# Patient Record
Sex: Female | Born: 1965 | Race: White | Hispanic: No | Smoking: Never smoker
Health system: Southern US, Community
[De-identification: ages and names within clinical notes are randomized; demographics above are authoritative.]

## PROBLEM LIST (undated history)

## (undated) DIAGNOSIS — J189 Pneumonia, unspecified organism: Secondary | ICD-10-CM

## (undated) DIAGNOSIS — F419 Anxiety disorder, unspecified: Secondary | ICD-10-CM

## (undated) DIAGNOSIS — M545 Low back pain, unspecified: Secondary | ICD-10-CM

## (undated) DIAGNOSIS — I10 Essential (primary) hypertension: Secondary | ICD-10-CM

## (undated) DIAGNOSIS — F329 Major depressive disorder, single episode, unspecified: Secondary | ICD-10-CM

## (undated) DIAGNOSIS — Z8719 Personal history of other diseases of the digestive system: Secondary | ICD-10-CM

## (undated) DIAGNOSIS — D839 Common variable immunodeficiency, unspecified: Secondary | ICD-10-CM

## (undated) DIAGNOSIS — J690 Pneumonitis due to inhalation of food and vomit: Secondary | ICD-10-CM

## (undated) DIAGNOSIS — K219 Gastro-esophageal reflux disease without esophagitis: Secondary | ICD-10-CM

## (undated) DIAGNOSIS — F319 Bipolar disorder, unspecified: Secondary | ICD-10-CM

## (undated) DIAGNOSIS — M1990 Unspecified osteoarthritis, unspecified site: Secondary | ICD-10-CM

## (undated) DIAGNOSIS — G8929 Other chronic pain: Secondary | ICD-10-CM

## (undated) DIAGNOSIS — T50902A Poisoning by unspecified drugs, medicaments and biological substances, intentional self-harm, initial encounter: Secondary | ICD-10-CM

## (undated) DIAGNOSIS — Z87442 Personal history of urinary calculi: Secondary | ICD-10-CM

## (undated) DIAGNOSIS — F32A Depression, unspecified: Secondary | ICD-10-CM

## (undated) HISTORY — PX: WRIST FRACTURE SURGERY: SHX121

## (undated) HISTORY — PX: APPENDECTOMY: SHX54

## (undated) HISTORY — PX: CHOLECYSTECTOMY OPEN: SUR202

## (undated) HISTORY — PX: CARPAL TUNNEL RELEASE: SHX101

## (undated) HISTORY — PX: NISSEN FUNDOPLICATION: SHX2091

## (undated) HISTORY — PX: ABDOMINAL HYSTERECTOMY: SHX81

## (undated) HISTORY — PX: TONSILLECTOMY: SUR1361

## (undated) HISTORY — PX: TOTAL SHOULDER REPLACEMENT: SUR1217

## (undated) HISTORY — PX: HERNIA REPAIR: SHX51

---

## 2000-01-24 ENCOUNTER — Other Ambulatory Visit: Admission: RE | Admit: 2000-01-24 | Discharge: 2000-01-24 | Payer: Self-pay | Admitting: Gynecology

## 2008-02-18 ENCOUNTER — Emergency Department (HOSPITAL_COMMUNITY): Admission: EM | Admit: 2008-02-18 | Discharge: 2008-02-18 | Payer: Self-pay | Admitting: Emergency Medicine

## 2009-08-12 ENCOUNTER — Emergency Department (HOSPITAL_COMMUNITY): Admission: EM | Admit: 2009-08-12 | Discharge: 2009-08-13 | Payer: Self-pay | Admitting: Emergency Medicine

## 2009-08-20 ENCOUNTER — Emergency Department (HOSPITAL_COMMUNITY): Admission: EM | Admit: 2009-08-20 | Discharge: 2009-08-21 | Payer: Self-pay | Admitting: Emergency Medicine

## 2010-11-04 ENCOUNTER — Ambulatory Visit (INDEPENDENT_AMBULATORY_CARE_PROVIDER_SITE_OTHER): Payer: Medicare Other | Admitting: Emergency Medicine

## 2010-11-04 ENCOUNTER — Encounter: Payer: Self-pay | Admitting: Emergency Medicine

## 2010-11-04 ENCOUNTER — Telehealth (INDEPENDENT_AMBULATORY_CARE_PROVIDER_SITE_OTHER): Payer: Self-pay | Admitting: *Deleted

## 2010-11-04 DIAGNOSIS — H00019 Hordeolum externum unspecified eye, unspecified eyelid: Secondary | ICD-10-CM | POA: Insufficient documentation

## 2010-11-04 DIAGNOSIS — J069 Acute upper respiratory infection, unspecified: Secondary | ICD-10-CM | POA: Insufficient documentation

## 2010-11-04 DIAGNOSIS — M7520 Bicipital tendinitis, unspecified shoulder: Secondary | ICD-10-CM

## 2010-11-09 NOTE — Assessment & Plan Note (Signed)
Summary: Sick/Ear Pain/Shoulder Pull   Vital Signs:  Patient profile:   45 year old female Height:      62 inches Weight:      164 pounds BMI:     30.10 O2 Sat:      97 % on Room air Temp:     99.1 degrees F oral Pulse rate:   100 / minute Resp:     16 per minute BP sitting:   129 / 96  (left arm) Cuff size:   regular  Vitals Entered By: Lajean Saver RN (November 04, 2010 9:48 AM)  O2 Flow:  Room air CC: congestion, runny nose, sinus pressure, fever, right eye drainage x 4 days, left shoulder pain x 5 days Is Patient Diabetic? No Pain Assessment Patient in pain? yes     Location: left shoulder Intensity: 5 Type: aching Onset of pain  Gradual Comments Patient c/o left shoulder pain and numbness x 5 days. She carried luggage about six days ago and believes it may be attributed to that. She developed congestion, sinus pressure/drainage, right eye swelling and drainage and HA 4 days ago. SHe has used alka seltzer cold.   Chief Complaint:  congestion, runny nose, sinus pressure, fever, right eye drainage x 4 days, and left shoulder pain x 5 days.  History of Present Illness: 1) Left shoulder pain.  Started this past weekend after carrying luggage.  No difficulty moving shoulder, no radiation of pain.  Not using any meds or modalities.  Located front of shoulder.  2) R eye redness.  No trauma, no debris.  Started a few days ago.  Using warm compress.  Mild redness, +lid irritation, no visual problems.  91) 44 Years Old Female complains of onset of cold symptoms for 5 days.  Jaysha has been using no OTC meds.  She is immocompromised.  She got it from her sister. + sore throat + cough No pleuritic pain No wheezing + nasal congestion + post-nasal drainage + sinus pain/pressure No chest congestion No itchy/red eyes No earache No hemoptysis No SOB No chills/sweats + fever (max 100) No nausea No vomiting No abdominal pain No diarrhea No skin rashes No fatigue No  myalgias + headache   Preventive Screening-Counseling & Management  Alcohol-Tobacco     Smoking Status: never      Drug Use:  no.    Allergies (verified): 1)  ! Jonne Ply  Past History:  Past Medical History: Chrones Hx of salmonella Monthly IVIG  Past Surgical History: Large bowel resection Cholecystectomy Portacath placement  Family History: Family History of Asthma Family History Hypertension  Social History: Never Smoked Alcohol use-no Drug use-no Married Smoking Status:  never Drug Use:  no  Physical Exam  General:  Well-developed,well-nourished,in no acute distress; alert,appropriate and cooperative throughout examination Head:  Normocephalic and atraumatic without obvious abnormalities. No apparent alopecia or balding. Eyes:  Stye upper inner lateral eyelid, mild conjunctival erythema Ears:  External ear exam shows no significant lesions or deformities.  Otoscopic examination reveals clear canals, tympanic membranes are intact bilaterally without bulging, retraction, inflammation or discharge. Hearing is grossly normal bilaterally. Nose:  clear discharge Mouth:  mild erythema, no exudates, OP patent, clear PND Lungs:  Normal respiratory effort, chest expands symmetrically. Lungs are clear to auscultation, no crackles or wheezes. Heart:  Normal rate and regular rhythm. S1 and S2 normal without gallop, murmur, click, rub or other extra sounds. Extremities:  L Shoulder: Inspection reveals no abnormalities, atrophy or asymmetry.  Palpation  shows + TTP bicipital groove.  ROM is full in all planes. Rotator cuff strength normal throughout. Negative Neer and Hawkin's tests, empty can.  + Yergason's test.  Neg O'briens, normal scapular function.  Distal NV status intact.   Impression & Recommendations:  Problem # 1:  STYE (ICD-373.11) Warm compresses Rx for Polytrim  Problem # 2:  URI (ICD-465.9)  1)  Take the prescribed antibiotic as instructed (Zpak) 2)  Use nasal  saline solution (over the counter) at least 3 times a day. 3)  Use over the counter decongestants like Zyrtec-D every 12 hours as needed to help with congestion. 4)  Can take tylenol every 6 hours or motrin every 8 hours for pain or fever. 5)  Follow up with your primary doctor  if no improvement in 5-7 days, sooner if increasing pain, fever, or new symptoms.    Her updated medication list for this problem includes:    Meloxicam 7.5 Mg Tabs (Meloxicam) .Marland Kitchen... 1 two times a day as needed for pain  Orders: Pulse Oximetry (94760)  Problem # 3:  BICEPS TENDINITIS (ICD-726.12) Ice, rest Rx for Mobic for a few days. If not improving by next week, call clinic and we will schedule you with sports medicine for possible injection vs PT Avoid lifting, pushing, pulling  Complete Medication List: 1)  Lipitor 80 Mg Tabs (Atorvastatin calcium) .... Once nightly 2)  Cymbalta 60 Mg Cpep (Duloxetine hcl) .... Once daily 3)  Polytrim 10000-0.1 Unit/ml-% Soln (Polymyxin b-trimethoprim) .... 2 drops r eye q6 hrs 4)  Meloxicam 7.5 Mg Tabs (Meloxicam) .Marland Kitchen.. 1 two times a day as needed for pain 5)  Zithromax Z-pak 250 Mg Tabs (Azithromycin) .... Use as directed Prescriptions: ZITHROMAX Z-PAK 250 MG TABS (AZITHROMYCIN) use as directed  #1 x 0   Entered and Authorized by:   Hoyt Koch MD   Signed by:   Hoyt Koch MD on 11/04/2010   Method used:   Print then Give to Patient   RxID:   [id number] MELOXICAM 7.5 MG TABS (MELOXICAM) 1 two times a day as needed for pain  #30 x 0   Entered and Authorized by:   Hoyt Koch MD   Signed by:   Hoyt Koch MD on 11/04/2010   Method used:   Print then Give to Patient   RxID:   [id number] POLYTRIM 10000-0.1 UNIT/ML-% SOLN (POLYMYXIN B-TRIMETHOPRIM) 2 drops R eye Q6 hrs  #1 bottle x 0   Entered and Authorized by:   Hoyt Koch MD   Signed by:   Hoyt Koch MD on 11/04/2010   Method used:   Print then Give to Patient    RxID:   [id number]    Orders Added: 1)  New Patient Level IV [53664] 2)  Pulse Oximetry [40347]

## 2010-11-17 NOTE — Progress Notes (Signed)
  Phone Note Outgoing Call   Call placed by: Lajean Saver RN,  November 04, 2010 1:39 PM Call placed to: [facility] Summary of Call: Pharmacy: [facility] [place] [phone]. Voltaren gel 1% apply two times a day, dispense 100gm tube. 0 refills.  Patient requested new rx for pain because rx med contains aspirin.

## 2011-01-04 LAB — RAPID URINE DRUG SCREEN, HOSP PERFORMED
Amphetamines: NOT DETECTED
Barbiturates: NOT DETECTED
Benzodiazepines: POSITIVE — AB

## 2011-01-04 LAB — DIFFERENTIAL
Basophils Absolute: 0.1 10*3/uL (ref 0.0–0.1)
Eosinophils Absolute: 0.1 10*3/uL (ref 0.0–0.7)
Eosinophils Relative: 2 % (ref 0–5)
Lymphocytes Relative: 37 % (ref 12–46)
Lymphs Abs: 2.5 10*3/uL (ref 0.7–4.0)
Monocytes Absolute: 0.4 10*3/uL (ref 0.1–1.0)

## 2011-01-04 LAB — CBC
HCT: 37.4 % (ref 36.0–46.0)
Hemoglobin: 12.7 g/dL (ref 12.0–15.0)
MCV: 97.3 fL (ref 78.0–100.0)
Platelets: 294 10*3/uL (ref 150–400)
RDW: 13.6 % (ref 11.5–15.5)

## 2011-01-04 LAB — BASIC METABOLIC PANEL
BUN: 15 mg/dL (ref 6–23)
CO2: 24 mEq/L (ref 19–32)
Chloride: 109 mEq/L (ref 96–112)
Glucose, Bld: 97 mg/dL (ref 70–99)
Potassium: 3.8 mEq/L (ref 3.5–5.1)
Sodium: 140 mEq/L (ref 135–145)

## 2011-01-04 LAB — ETHANOL: Alcohol, Ethyl (B): 88 mg/dL — ABNORMAL HIGH (ref 0–10)

## 2018-06-30 ENCOUNTER — Emergency Department (HOSPITAL_COMMUNITY): Payer: Medicare Other

## 2018-06-30 ENCOUNTER — Inpatient Hospital Stay (HOSPITAL_COMMUNITY)
Admission: EM | Admit: 2018-06-30 | Discharge: 2018-07-04 | DRG: 917 | Disposition: A | Discharge status: Other Acute Inpatient Hospital | Payer: Medicare Other | Attending: Family Medicine | Admitting: Family Medicine

## 2018-06-30 ENCOUNTER — Other Ambulatory Visit: Payer: Self-pay

## 2018-06-30 DIAGNOSIS — E872 Acidosis, unspecified: Secondary | ICD-10-CM

## 2018-06-30 DIAGNOSIS — R7401 Elevation of levels of liver transaminase levels: Secondary | ICD-10-CM | POA: Diagnosis present

## 2018-06-30 DIAGNOSIS — W19XXXA Unspecified fall, initial encounter: Secondary | ICD-10-CM | POA: Diagnosis not present

## 2018-06-30 DIAGNOSIS — M25559 Pain in unspecified hip: Secondary | ICD-10-CM | POA: Diagnosis present

## 2018-06-30 DIAGNOSIS — K219 Gastro-esophageal reflux disease without esophagitis: Secondary | ICD-10-CM | POA: Diagnosis present

## 2018-06-30 DIAGNOSIS — F319 Bipolar disorder, unspecified: Secondary | ICD-10-CM | POA: Diagnosis present

## 2018-06-30 DIAGNOSIS — N179 Acute kidney failure, unspecified: Secondary | ICD-10-CM | POA: Diagnosis present

## 2018-06-30 DIAGNOSIS — R9431 Abnormal electrocardiogram [ECG] [EKG]: Secondary | ICD-10-CM

## 2018-06-30 DIAGNOSIS — I4581 Long QT syndrome: Secondary | ICD-10-CM | POA: Diagnosis present

## 2018-06-30 DIAGNOSIS — T50902A Poisoning by unspecified drugs, medicaments and biological substances, intentional self-harm, initial encounter: Secondary | ICD-10-CM

## 2018-06-30 DIAGNOSIS — Z87440 Personal history of urinary (tract) infections: Secondary | ICD-10-CM

## 2018-06-30 DIAGNOSIS — J181 Lobar pneumonia, unspecified organism: Secondary | ICD-10-CM

## 2018-06-30 DIAGNOSIS — J690 Pneumonitis due to inhalation of food and vomit: Secondary | ICD-10-CM | POA: Diagnosis not present

## 2018-06-30 DIAGNOSIS — G92 Toxic encephalopathy: Secondary | ICD-10-CM | POA: Diagnosis present

## 2018-06-30 DIAGNOSIS — Z79899 Other long term (current) drug therapy: Secondary | ICD-10-CM | POA: Diagnosis not present

## 2018-06-30 DIAGNOSIS — J9602 Acute respiratory failure with hypercapnia: Secondary | ICD-10-CM

## 2018-06-30 DIAGNOSIS — E861 Hypovolemia: Secondary | ICD-10-CM | POA: Diagnosis present

## 2018-06-30 DIAGNOSIS — Z888 Allergy status to other drugs, medicaments and biological substances status: Secondary | ICD-10-CM

## 2018-06-30 DIAGNOSIS — M810 Age-related osteoporosis without current pathological fracture: Secondary | ICD-10-CM | POA: Diagnosis present

## 2018-06-30 DIAGNOSIS — T424X2A Poisoning by benzodiazepines, intentional self-harm, initial encounter: Secondary | ICD-10-CM | POA: Diagnosis present

## 2018-06-30 DIAGNOSIS — T1491XA Suicide attempt, initial encounter: Secondary | ICD-10-CM | POA: Diagnosis not present

## 2018-06-30 DIAGNOSIS — G8929 Other chronic pain: Secondary | ICD-10-CM | POA: Diagnosis present

## 2018-06-30 DIAGNOSIS — G2581 Restless legs syndrome: Secondary | ICD-10-CM | POA: Diagnosis present

## 2018-06-30 DIAGNOSIS — J189 Pneumonia, unspecified organism: Secondary | ICD-10-CM

## 2018-06-30 DIAGNOSIS — F419 Anxiety disorder, unspecified: Secondary | ICD-10-CM | POA: Diagnosis present

## 2018-06-30 DIAGNOSIS — T796XXA Traumatic ischemia of muscle, initial encounter: Secondary | ICD-10-CM | POA: Diagnosis not present

## 2018-06-30 DIAGNOSIS — M549 Dorsalgia, unspecified: Secondary | ICD-10-CM | POA: Diagnosis present

## 2018-06-30 DIAGNOSIS — M6282 Rhabdomyolysis: Secondary | ICD-10-CM | POA: Diagnosis present

## 2018-06-30 DIAGNOSIS — Y92009 Unspecified place in unspecified non-institutional (private) residence as the place of occurrence of the external cause: Secondary | ICD-10-CM | POA: Diagnosis not present

## 2018-06-30 DIAGNOSIS — Z915 Personal history of self-harm: Secondary | ICD-10-CM

## 2018-06-30 DIAGNOSIS — I10 Essential (primary) hypertension: Secondary | ICD-10-CM | POA: Diagnosis present

## 2018-06-30 DIAGNOSIS — M5124 Other intervertebral disc displacement, thoracic region: Secondary | ICD-10-CM | POA: Diagnosis present

## 2018-06-30 DIAGNOSIS — Z886 Allergy status to analgesic agent status: Secondary | ICD-10-CM

## 2018-06-30 DIAGNOSIS — T50901A Poisoning by unspecified drugs, medicaments and biological substances, accidental (unintentional), initial encounter: Secondary | ICD-10-CM | POA: Diagnosis present

## 2018-06-30 DIAGNOSIS — R740 Nonspecific elevation of levels of transaminase and lactic acid dehydrogenase [LDH]: Secondary | ICD-10-CM | POA: Diagnosis present

## 2018-06-30 DIAGNOSIS — T50904A Poisoning by unspecified drugs, medicaments and biological substances, undetermined, initial encounter: Secondary | ICD-10-CM | POA: Diagnosis not present

## 2018-06-30 DIAGNOSIS — G9341 Metabolic encephalopathy: Secondary | ICD-10-CM | POA: Diagnosis not present

## 2018-06-30 DIAGNOSIS — T481X2A Poisoning by skeletal muscle relaxants [neuromuscular blocking agents], intentional self-harm, initial encounter: Principal | ICD-10-CM | POA: Diagnosis present

## 2018-06-30 DIAGNOSIS — R52 Pain, unspecified: Secondary | ICD-10-CM

## 2018-06-30 DIAGNOSIS — K589 Irritable bowel syndrome without diarrhea: Secondary | ICD-10-CM | POA: Diagnosis present

## 2018-06-30 HISTORY — DX: Low back pain: M54.5

## 2018-06-30 HISTORY — DX: Personal history of other diseases of the digestive system: Z87.19

## 2018-06-30 HISTORY — DX: Depression, unspecified: F32.A

## 2018-06-30 HISTORY — DX: Poisoning by unspecified drugs, medicaments and biological substances, intentional self-harm, initial encounter: T50.902A

## 2018-06-30 HISTORY — DX: Essential (primary) hypertension: I10

## 2018-06-30 HISTORY — DX: Major depressive disorder, single episode, unspecified: F32.9

## 2018-06-30 HISTORY — DX: Other chronic pain: G89.29

## 2018-06-30 HISTORY — DX: Anxiety disorder, unspecified: F41.9

## 2018-06-30 HISTORY — DX: Common variable immunodeficiency, unspecified: D83.9

## 2018-06-30 HISTORY — DX: Unspecified osteoarthritis, unspecified site: M19.90

## 2018-06-30 HISTORY — DX: Pneumonitis due to inhalation of food and vomit: J69.0

## 2018-06-30 HISTORY — DX: Personal history of urinary calculi: Z87.442

## 2018-06-30 HISTORY — DX: Gastro-esophageal reflux disease without esophagitis: K21.9

## 2018-06-30 HISTORY — DX: Pneumonia, unspecified organism: J18.9

## 2018-06-30 HISTORY — DX: Low back pain, unspecified: M54.50

## 2018-06-30 HISTORY — DX: Bipolar disorder, unspecified: F31.9

## 2018-06-30 LAB — COMPREHENSIVE METABOLIC PANEL
ALK PHOS: 61 U/L (ref 38–126)
ALT: 84 U/L — AB (ref 0–44)
AST: 185 U/L — ABNORMAL HIGH (ref 15–41)
Albumin: 3.7 g/dL (ref 3.5–5.0)
Anion gap: 19 — ABNORMAL HIGH (ref 5–15)
BUN: 23 mg/dL — ABNORMAL HIGH (ref 6–20)
CO2: 15 mmol/L — ABNORMAL LOW (ref 22–32)
CREATININE: 1.61 mg/dL — AB (ref 0.44–1.00)
Calcium: 8.6 mg/dL — ABNORMAL LOW (ref 8.9–10.3)
Chloride: 103 mmol/L (ref 98–111)
GFR calc Af Amer: 42 mL/min — ABNORMAL LOW (ref >60)
GFR, EST NON AFRICAN AMERICAN: 36 mL/min — AB (ref >60)
Glucose, Bld: 96 mg/dL (ref 70–99)
Potassium: 4.4 mmol/L (ref 3.5–5.1)
Sodium: 137 mmol/L (ref 135–145)
Total Bilirubin: 1.3 mg/dL — ABNORMAL HIGH (ref 0.3–1.2)
Total Protein: 6.5 g/dL (ref 6.5–8.1)

## 2018-06-30 LAB — CBC WITH DIFFERENTIAL/PLATELET
BASOS ABS: 0.1 10*3/uL (ref 0.0–0.1)
Basophils Relative: 1 %
Eosinophils Absolute: 0.1 10*3/uL (ref 0.0–0.7)
Eosinophils Relative: 1 %
HEMATOCRIT: 50.2 % — AB (ref 36.0–46.0)
HEMOGLOBIN: 16.5 g/dL — AB (ref 12.0–15.0)
LYMPHS PCT: 15 %
Lymphs Abs: 0.9 10*3/uL (ref 0.7–4.0)
MCH: 32.9 pg (ref 26.0–34.0)
MCHC: 32.9 g/dL (ref 30.0–36.0)
MCV: 100.2 fL — AB (ref 78.0–100.0)
MONOS PCT: 9 %
Monocytes Absolute: 0.5 10*3/uL (ref 0.1–1.0)
NEUTROS ABS: 4.5 10*3/uL (ref 1.7–7.7)
Neutrophils Relative %: 74 %
Platelets: 249 10*3/uL (ref 150–400)
RBC: 5.01 MIL/uL (ref 3.87–5.11)
RDW: 11.9 % (ref 11.5–15.5)
WBC: 6.1 10*3/uL (ref 4.0–10.5)

## 2018-06-30 LAB — URINALYSIS, ROUTINE W REFLEX MICROSCOPIC
Bilirubin Urine: NEGATIVE
Glucose, UA: NEGATIVE mg/dL
KETONES UR: 80 mg/dL — AB
Leukocytes, UA: NEGATIVE
Nitrite: NEGATIVE
PROTEIN: 30 mg/dL — AB
Specific Gravity, Urine: 1.019 (ref 1.005–1.030)
pH: 5 (ref 5.0–8.0)

## 2018-06-30 LAB — I-STAT VENOUS BLOOD GAS, ED
ACID-BASE DEFICIT: 8 mmol/L — AB (ref 0.0–2.0)
Bicarbonate: 17.6 mmol/L — ABNORMAL LOW (ref 20.0–28.0)
O2 SAT: 97 %
PCO2 VEN: 36 mmHg — AB (ref 44.0–60.0)
TCO2: 19 mmol/L — ABNORMAL LOW (ref 22–32)
pH, Ven: 7.297 (ref 7.250–7.430)
pO2, Ven: 95 mmHg — ABNORMAL HIGH (ref 32.0–45.0)

## 2018-06-30 LAB — I-STAT ARTERIAL BLOOD GAS, ED
ACID-BASE DEFICIT: 13 mmol/L — AB (ref 0.0–2.0)
ACID-BASE DEFICIT: 12 mmol/L — AB (ref 0.0–2.0)
BICARBONATE: 17.4 mmol/L — AB (ref 20.0–28.0)
Bicarbonate: 15.4 mmol/L — ABNORMAL LOW (ref 20.0–28.0)
O2 SAT: 97 %
O2 Saturation: 95 %
TCO2: 19 mmol/L — AB (ref 22–32)
TCO2: 17 mmol/L — AB (ref 22–32)
pCO2 arterial: 58.4 mmHg — ABNORMAL HIGH (ref 32.0–48.0)
pCO2 arterial: 38.9 mmHg (ref 32.0–48.0)
pH, Arterial: 7.087 — CL (ref 7.350–7.450)
pH, Arterial: 7.209 — ABNORMAL LOW (ref 7.350–7.450)
pO2, Arterial: 104 mmHg (ref 83.0–108.0)
pO2, Arterial: 107 mmHg (ref 83.0–108.0)

## 2018-06-30 LAB — ETHANOL: Alcohol, Ethyl (B): <10 mg/dL (ref <10)

## 2018-06-30 LAB — RAPID URINE DRUG SCREEN, HOSP PERFORMED
AMPHETAMINES: POSITIVE — AB
BARBITURATES: NOT DETECTED
BENZODIAZEPINES: POSITIVE — AB
Cocaine: NOT DETECTED
Opiates: POSITIVE — AB
TETRAHYDROCANNABINOL: NOT DETECTED

## 2018-06-30 LAB — I-STAT CG4 LACTIC ACID, ED: LACTIC ACID, VENOUS: 2.56 mmol/L — AB (ref 0.5–1.9)

## 2018-06-30 LAB — MAGNESIUM: Magnesium: 2 mg/dL (ref 1.7–2.4)

## 2018-06-30 LAB — SALICYLATE LEVEL

## 2018-06-30 LAB — LACTIC ACID, PLASMA: LACTIC ACID, VENOUS: 1.5 mmol/L (ref 0.5–1.9)

## 2018-06-30 LAB — PROCALCITONIN: PROCALCITONIN: 24.13 ng/mL

## 2018-06-30 LAB — ACETAMINOPHEN LEVEL: Acetaminophen (Tylenol), Serum: <10 ug/mL — ABNORMAL LOW (ref 10–30)

## 2018-06-30 MED ORDER — ROCURONIUM BROMIDE 50 MG/5ML IV SOLN
INTRAVENOUS | Status: AC | PRN
  Administered 2018-06-30: 100 mg via INTRAVENOUS

## 2018-06-30 MED ORDER — FENTANYL CITRATE (PF) 100 MCG/2ML IJ SOLN
50.0000 ug | INTRAMUSCULAR | Status: DC | PRN
  Administered 2018-06-30: 50 ug via INTRAVENOUS
  Filled 2018-06-30: qty 2

## 2018-06-30 MED ORDER — PANTOPRAZOLE SODIUM 40 MG PO PACK: 40.0000 mg | PACK | Freq: Every day | ORAL | Status: DC

## 2018-06-30 MED ORDER — ROCURONIUM BROMIDE 50 MG/5ML IV SOLN
80.0000 mg | Freq: Once | INTRAVENOUS | Status: DC
  Filled 2018-06-30: qty 8

## 2018-06-30 MED ORDER — FENTANYL CITRATE (PF) 100 MCG/2ML IJ SOLN
50.0000 ug | INTRAMUSCULAR | Status: DC | PRN
  Administered 2018-07-01: 50 ug via INTRAVENOUS
  Filled 2018-06-30: qty 2

## 2018-06-30 MED ORDER — PROPOFOL 1000 MG/100ML IV EMUL
INTRAVENOUS | Status: AC
  Filled 2018-06-30: qty 100

## 2018-06-30 MED ORDER — LACTATED RINGERS IV SOLN
INTRAVENOUS | Status: DC
  Administered 2018-07-01: 01:00:00 via INTRAVENOUS

## 2018-06-30 MED ORDER — FENTANYL CITRATE (PF) 100 MCG/2ML IJ SOLN: 50.0000 ug | INTRAMUSCULAR | Status: DC | PRN

## 2018-06-30 MED ORDER — PIPERACILLIN-TAZOBACTAM 3.375 G IVPB
3.3750 g | Freq: Three times a day (TID) | INTRAVENOUS | Status: DC
  Administered 2018-07-01 – 2018-07-03 (×8): 3.375 g via INTRAVENOUS
  Filled 2018-06-30 (×11): qty 50

## 2018-06-30 MED ORDER — PIPERACILLIN-TAZOBACTAM 3.375 G IVPB 30 MIN
3.3750 g | Freq: Once | INTRAVENOUS | Status: AC
  Administered 2018-06-30: 3.375 g via INTRAVENOUS
  Filled 2018-06-30: qty 50

## 2018-06-30 MED ORDER — SODIUM CHLORIDE 0.9 % IV BOLUS
1000.0000 mL | Freq: Once | INTRAVENOUS | Status: AC
  Administered 2018-06-30: 1000 mL via INTRAVENOUS

## 2018-06-30 MED ORDER — HEPARIN SODIUM (PORCINE) 5000 UNIT/ML IJ SOLN
5000.0000 [IU] | Freq: Three times a day (TID) | INTRAMUSCULAR | Status: DC
  Administered 2018-07-01 – 2018-07-03 (×9): 5000 [IU] via SUBCUTANEOUS
  Filled 2018-06-30 (×9): qty 1

## 2018-06-30 MED ORDER — LACTATED RINGERS IV BOLUS
500.0000 mL | Freq: Once | INTRAVENOUS | Status: AC
  Administered 2018-06-30: 500 mL via INTRAVENOUS

## 2018-06-30 NOTE — Progress Notes (Signed)
Pharmacy Antibiotic Note  Kim Massey is a 52 y.o. female admitted on 06/30/2018 with possible aspiration pneumonia.  Pharmacy has been consulted for zosyn dosing. -WBC= 6.1, afeb, SCr= 1.61, CrCL ~ 40 -zosyn 3.375gm given at ~ 8pm  Plan: -Zosyn 3.375gm IV q8h -Will follow renal function, cultures and clinical progress    Height: 5\' 2"  (157.5 cm) Weight: 150 lb (68 kg) IBW/kg (Calculated) : 50.1  Temp (24hrs), Avg:98.8 F (37.1 C), Min:96.6 F (35.9 C), Max:99.9 F (37.7 C)  Recent Labs  Lab 06/30/18 1752 06/30/18 1815  WBC 6.1  --   CREATININE 1.61*  --   LATICACIDVEN  --  2.56*    Estimated Creatinine Clearance: 37.4 mL/min (A) (by C-G formula based on SCr of 1.61 mg/dL (H)).    Allergies  Allergen Reactions  . Nsaids Other (See Comments)    Due to crohn's disease  . Propoxyphene Nausea And Vomiting  . Aspirin Other (See Comments)    bleeding      Thank you for allowing pharmacy to be a part of this patient's care.  Harland German, PharmD Clinical Pharmacist Please check Amion for pharmacy contact number

## 2018-06-30 NOTE — Progress Notes (Signed)
   06/30/18 2100  Clinical Encounter Type  Visited With Family;Health care provider  Visit Type Initial;ED  Stress Factors  Family Stress Factors Loss of control;Other (Comment) (worried about their friend)   Spoke w/ boyfriend and another friend of pt in waiting room.  Conveyed information they shared w/ the care RN.  Per boyfriend, pt does have family, but he did not think they would be coming to hospital.  Margretta Sidle resident

## 2018-06-30 NOTE — ED Triage Notes (Signed)
Pt brought in by [facility] from home- roommate found pt on floor agonal and cyanotic with two empty pill bottles around her (cyclobenzaprine and clonazepam). Pt given 2mg  narcan IN by fire with some improvement. Pt responding only to painful stimuli on arrival, EDP at bedside.

## 2018-06-30 NOTE — ED Provider Notes (Signed)
Procedure Name: Intubation Date/Time: 06/30/2018 8:55 PM Performed by: Keenan Bachelor, MD Pre-anesthesia Checklist: Patient identified, Emergency Drugs available, Suction available, Patient being monitored and Timeout performed Oxygen Delivery Method: Non-rebreather mask Preoxygenation: Pre-oxygenation with 100% oxygen Induction Type: Rapid sequence Ventilation: Mask ventilation without difficulty Laryngoscope Size: Mac and 3 Grade View: Grade I Tube size: 7.5 mm Number of attempts: 1 Airway Equipment and Method: Rigid stylet and Video-laryngoscopy Secured at: 23 cm Tube secured with: ETT holder Difficulty Due To: Difficulty was unanticipated         Keenan Bachelor, MD 06/30/18 2056    Elnora Morrison, MD 06/30/18 2218

## 2018-06-30 NOTE — H&P (Addendum)
..   NAME:  Kim Massey, MRN:  [id number], DOB:  02/04/1966, LOS: 0 ADMISSION DATE:  06/30/2018, CONSULTATION DATE:  06/30/18 REFERRING MD:  Jodi Mourning MD- EDP, CHIEF COMPLAINT:  Overdose   Brief History   52 yr old female with PMHx of Bipolar, HTN and CVID w/ suspected overdose ( found next to empty bottles of clonazepam and Flexeril) Utox Positive for Opiates, Amphetamines and Benzodiazepines. Intubated for airway protection PCCM consulted for admission.   Past Medical History( per Care Everywhere and EMR)  - Bipolar - HTN - Osteoporosis - CVID Dx 2009 -  Significant Hospital Events   Presented w/ OD intubated  Consults: date of consult/date signed off & final recs:  None to date  Procedures (surgical and bedside):  Endotracheal intubation by EDP  Significant Diagnostic Tests:  Utox + Benzodiazepines, Opiates and Amphetamines Both post intubation ABG: 7.087/58/104/17---> 7.20/30/107/15  Micro Data:  pending  Antimicrobials:  Zosyn IV empirically   Subjective:  Unable to obtain any history from patient due to acute toxic encephalopathy, intubated.  Objective   Blood pressure 126/75, pulse (!) 117, temperature 98.8 F (37.1 C), resp. rate 18, height 5\' 2"  (1.575 m), weight 68 kg, SpO2 100 %.    Vent Mode: PRVC FiO2 (%):  [100 %] 100 % Set Rate:  [14 bmp-18 bmp] 18 bmp Vt Set:  [400 mL] 400 mL PEEP:  [5 cmH20] 5 cmH20 Plateau Pressure:  [18 cmH20] 18 cmH20   Intake/Output Summary (Last 24 hours) at 06/30/2018 2138 Last data filed at 06/30/2018 2105 Gross per 24 hour  Intake 2600 ml  Output -  Net 2600 ml   Filed Weights   06/30/18 2014  Weight: 68 kg    Examination: General: well nourished female in no distress HENT: intubated oropharynx pink moist mucosa PERRLA Lungs: good air entry b/l no wheezing or rhonci Cardiovascular: S1 and S2 appreciated without murmur Abdomen: soft abdomen nondistended some tenderness in suprapubic area + Bs in all 4  quadrants Extremities: Right thigh appear slightly hypertrophied in comparison to left with some erythema and warm to touch. + 1 pitting edema b/l Neuro: grimaces and withdraws from noxious stimuli, opens eyes to name, moves all 4 extremities GU: erythematous follicles seen around mons pubis, indwelling temp foley in place otherwise not impressive   Assessment & Plan:  52 yr old female with PMHx CVID and Bipolar dz presents to [facility] s/p suspected intentional OD. Pills found include flexeril and clonazepam. Intubated for airway protection. PCCM consulted for admission  1. Acute toxic metabolic encephalopathy secondary to overdose with benzodiazepines, opiates and amphetamines.  Plan: - Cardiac monitoring, Prolonged QT : QTc last was 477. Avoid QT prolonging meds - Respiratory depression with benzodiazepines and opiates. Pt is endotracheally intubated and will continue on mechanical ventilation until she can maintain a state of wakefulness. No continuous sedation at this time. Neurochecks Q 4. Seizure and Aspiration precautions.  - Intentional OD? Once extubated she will need a psychiatric evaluation as this may have been an attempt to harm herself. She has a h/o Bipolar type 1.   2.  Respiratory acidosis with Anion Gap Metabolic Acidosis- AG 19  PCO2 has improved with mechanical ventilation however ph still 7.2 due to metabolic acidosis. initially LA 2.56--> LA 1.5 post IVF resuscitation will check CK.  BG wnl, salicylates < 7 tylenol <10 and ETOH <10. BUN elevated from baseline normal 15 now 23, continue to monitor UOP  Continue IVF.    3. Possible Pneumonia  Elevated PCT- WBC 6.1 Temp max 99.7deg F Dense right upper lobe consolidation concerning for a pneumonia. Additional mild infiltrate at the right lateral lung base.- will start on broad spectrum abx Vancomycin and Zosyn given h/o CVID pt is susceptible to infections such as Strep pneumo - will send Urine strep. Other sources of infection: Pt has  a swollen erythematous right quadricep. No puncture marks seen. Will continue to monitor may warrant further investigation  4.  + Ketones in urine, ETOH level <10.  Macrocytosis on CBC   5. H/o Bipolar Type 1 - h/o a single manic episode coupled with major Depression ( recently broke up with her significant other which may have been precitipus for this current OD attempt) normally controlled with Duloxetine 90mg  daily per July 2019 last PCP visit and Topamax 50 mg daily.   6. Pt has a h/o GERD and IBS: Currently taking Questran, Colestid and Bentyl. Still reports diarrhea. Continues Protonix 40 mg daily. Last colonoscopy was 09/2017 and she had 8 polyps removed which were benign.   7. RLS- was being treated with Klonipin - will hold this medication as she may have used it for OD. Check iron studies.   Disposition / Summary of Today's Plan 06/30/18   Admit to ICU Continues on mechanical ventilation Wean sedation to see neurological response Continue IVF for lactic acidosis- hopefully clear lactate. F/u CK and repeat ABG    Diet: NPO Pain/Anxiety/Delirium protocol (if indicated): hold sedation and analgesia at this time for neurological assessment VAP protocol (if indicated): yes DVT prophylaxis: SCDs and Hep [place] GI prophylaxis: yes protonix Hyperglycemia protocol: if BG>180mg /dl will start ISS for goal 140-180 mg/dl Mobility: bedrest Code Status: Full code Family Communication: none at bedside at the time of my assessment will call to update  Labs   CBC: Recent Labs  Lab 06/30/18 1752  WBC 6.1  NEUTROABS 4.5  HGB 16.5*  HCT 50.2*  MCV 100.2*  PLT 249    Basic Metabolic Panel: Recent Labs  Lab 06/30/18 1752  NA 137  K 4.4  CL 103  CO2 15*  GLUCOSE 96  BUN 23*  CREATININE 1.61*  CALCIUM 8.6*  MG 2.0   GFR: Estimated Creatinine Clearance: 37.4 mL/min (A) (by C-G formula based on SCr of 1.61 mg/dL (H)). Recent Labs  Lab 06/30/18 1752 06/30/18 1815  WBC 6.1  --    LATICACIDVEN  --  2.56*    Liver Function Tests: Recent Labs  Lab 06/30/18 1752  AST 185*  ALT 84*  ALKPHOS 61  BILITOT 1.3*  PROT 6.5  ALBUMIN 3.7   No results for input(s): LIPASE, AMYLASE in the last 168 hours. No results for input(s): AMMONIA in the last 168 hours.  ABG    Component Value Date/Time   PHART 7.087 (LL) 06/30/2018 1939   PCO2ART 58.4 (H) 06/30/2018 1939   PO2ART 104.0 06/30/2018 1939   HCO3 17.4 (L) 06/30/2018 1939   TCO2 19 (L) 06/30/2018 1939   ACIDBASEDEF 13.0 (H) 06/30/2018 1939   O2SAT 95.0 06/30/2018 1939     Coagulation Profile: No results for input(s): INR, PROTIME in the last 168 hours.  Cardiac Enzymes: No results for input(s): CKTOTAL, CKMB, CKMBINDEX, TROPONINI in the last 168 hours.  HbA1C: No results found for: HGBA1C  CBG: No results for input(s): GLUCAP in the last 168 hours.  Admitting History of Present Illness.   Per EMR and accounts of other providers as patient is acutely encephalopathic  52 yr old female  with PMHx of Bipolar, HTN and CVID presented to [facility] on 9/[facility] brought in by [facility] from home- roommate found pt on floor agonal and cyanotic with two empty pill bottles around her (cyclobenzaprine and clonazepam). Pt given 2mg  narcan IN by fire with some improvement. Pt responding only to painful stimuli on arrival,    Per EDP documentation: no family at bedside (Chaplain spoke to boyfriend who stated that she had family but he didn't think they would come to the hospital) presents with EMS after being found by reported roommate unresponsive with empty pill bottles at the bedside.  They found empty bottles of clonazepam and Flexeril.  No other drugs known.  Patient was unresponsive for EMS on arrival and continued on route.  Unable to get details.  On my evaluation: pt withdraws from painful stimuli and opens her eyes to her name. Remains intubated hemodynamically stable Review of Systems:   Marland KitchenMarland KitchenReview of Systems  Unable to  perform ROS: Critical illness  intubated and intoxicated   Past Medical History  She,  has no past medical history on file.   Surgical History   none  Social History   Social History   Socioeconomic History  . Marital status: Unknown    Spouse name: Not on file  . Number of children: Not on file  . Years of education: Not on file  . Highest education level: Not on file  Occupational History  . Not on file  Social Needs  . Financial resource strain: Not on file  . Food insecurity:    Worry: Not on file    Inability: Not on file  . Transportation needs:    Medical: Not on file    Non-medical: Not on file  Tobacco Use  . Smoking status: Not on file  Substance and Sexual Activity  . Alcohol use: Not on file  . Drug use: Not on file  . Sexual activity: Not on file  Lifestyle  . Physical activity:    Days per week: Not on file    Minutes per session: Not on file  . Stress: Not on file  Relationships  . Social connections:    Talks on phone: Not on file    Gets together: Not on file    Attends religious service: Not on file    Active member of club or organization: Not on file    Attends meetings of clubs or organizations: Not on file    Relationship status: Not on file  . Intimate partner violence:    Fear of current or ex partner: Not on file    Emotionally abused: Not on file    Physically abused: Not on file    Forced sexual activity: Not on file  Other Topics Concern  . Not on file  Social History Narrative  . Not on file  ,     Family History   Her family history is not on file.   Allergies Allergies  Allergen Reactions  . Nsaids Other (See Comments)    Due to crohn's disease  . Propoxyphene Nausea And Vomiting  . Aspirin Other (See Comments)    bleeding     Home Medications  Prior to Admission medications   Medication Sig Start Date End Date Taking? Authorizing Provider  acetaminophen (TYLENOL) 500 MG tablet Take 1,000 mg by mouth every 4  (four) hours as needed for headache (pain).   Yes [provider]  baclofen (LIORESAL) 10 MG tablet Take 10 mg by mouth  3 (three) times daily. 06/26/18  Yes [provider]  clonazePAM (KLONOPIN) 0.5 MG tablet Take 0.5 mg by mouth 2 (two) times daily as needed for anxiety.  06/18/18  Yes [provider]  cyclobenzaprine (FLEXERIL) 5 MG tablet Take 5 mg by mouth 2 (two) times daily. 06/25/18  Yes [provider]  DULoxetine (CYMBALTA) 60 MG capsule Take 60 mg by mouth daily. 04/09/18  Yes [provider]  HYDROcodone-acetaminophen (NORCO/VICODIN) 5-325 MG tablet Take 1 tablet by mouth every 8 (eight) hours as needed (pain).  06/26/18  Yes [provider]  loratadine (CLARITIN) 10 MG tablet Take 10 mg by mouth daily as needed for allergies.   Yes [provider]  pantoprazole (PROTONIX) 40 MG tablet Take 40 mg by mouth daily.   Yes [provider]  gabapentin (NEURONTIN) 100 MG capsule Take 100 mg by mouth See admin instructions. Start date 06/18/18: take 1 capsule (100 mg) by mouth at bedtime for 4 days, then take 1 capsule (100 mg) twice daily for 4 days, then take 1 capsule (100 mg) three times daily. 06/18/18   [provider]  gabapentin (NEURONTIN) 300 MG capsule Take 300 mg by mouth See admin instructions. Start date 06/18/18: take 1 capsule (100 mg) by mouth at bedtime for 4 days, then take 1 capsule (100 mg) twice daily for 4 days, then take 1 capsule (100 mg) three times daily. 06/18/18   [provider]       I, Dr Newell Coral have personally reviewed patient's available data, including medical history, events of note, physical examination and test results as part of my evaluation. I have discussed with NP and other care providers such as pharmacist, RN and Elink.  In addition,  I personally evaluated patient The patient is critically ill with multiple organ systems failure and requires high complexity  decision making for assessment and support, frequent evaluation and titration of therapies, application of advanced monitoring technologies and extensive interpretation of multiple databases.   Critical Care Time devoted to patient care services described in this note is 55 Minutes. This time reflects time of care of this signee Dr Newell Coral. This critical care time does not reflect procedure time, or teaching time or supervisory time of NP but could involve care discussion time   CC TIME:55  minutes CODE STATUS:FULL DISPOSITION: ICU PROGNOSIS: Guarded   Dr. Newell Coral Pulmonary Critical Care Medicine  06/30/2018 10:55 PM

## 2018-06-30 NOTE — ED Provider Notes (Addendum)
[facility] EMERGENCY DEPARTMENT Provider Note   CSN: [id number] Arrival date & time: 06/30/18  1738     History   Chief Complaint Chief Complaint  Patient presents with  . Ingestion    HPI Kim Massey is a 52 y.o. female.  Patient with no known significant medical history, no family at bedside presents with EMS after being found by reported roommate unresponsive with empty pill bottles at the bedside.  They found empty bottles of clonazepam and Flexeril.  No other drugs known.  Patient was unresponsive for EMS on arrival and continued on route.  Unable to get details.     No past medical history on file.  Patient Active Problem List   Diagnosis Date Noted  . Overdose 06/30/2018  . STYE 11/04/2010  . URI 11/04/2010  . BICEPS TENDINITIS 11/04/2010       OB History   None      Home Medications    Prior to Admission medications   Medication Sig Start Date End Date Taking? Authorizing Provider  acetaminophen (TYLENOL) 500 MG tablet Take 1,000 mg by mouth every 4 (four) hours as needed for headache (pain).   Yes [provider]  baclofen (LIORESAL) 10 MG tablet Take 10 mg by mouth 3 (three) times daily. 06/26/18  Yes [provider]  clonazePAM (KLONOPIN) 0.5 MG tablet Take 0.5 mg by mouth 2 (two) times daily as needed for anxiety.  06/18/18  Yes [provider]  cyclobenzaprine (FLEXERIL) 5 MG tablet Take 5 mg by mouth 2 (two) times daily. 06/25/18  Yes [provider]  DULoxetine (CYMBALTA) 60 MG capsule Take 60 mg by mouth daily. 04/09/18  Yes [provider]  HYDROcodone-acetaminophen (NORCO/VICODIN) 5-325 MG tablet Take 1 tablet by mouth every 8 (eight) hours as needed (pain).  06/26/18  Yes [provider]  loratadine (CLARITIN) 10 MG tablet Take 10 mg by mouth daily as needed for allergies.   Yes [provider]  pantoprazole (PROTONIX) 40 MG tablet Take 40 mg by mouth daily.   Yes  [provider]  gabapentin (NEURONTIN) 100 MG capsule Take 100 mg by mouth See admin instructions. Start date 06/18/18: take 1 capsule (100 mg) by mouth at bedtime for 4 days, then take 1 capsule (100 mg) twice daily for 4 days, then take 1 capsule (100 mg) three times daily. 06/18/18   [provider]  gabapentin (NEURONTIN) 300 MG capsule Take 300 mg by mouth See admin instructions. Start date 06/18/18: take 1 capsule (100 mg) by mouth at bedtime for 4 days, then take 1 capsule (100 mg) twice daily for 4 days, then take 1 capsule (100 mg) three times daily. 06/18/18   [provider]    Family History No family history on file.  Social History Social History   Tobacco Use  . Smoking status: Not on file  Substance Use Topics  . Alcohol use: Not on file  . Drug use: Not on file     Allergies   Nsaids; Propoxyphene; and Aspirin   Review of Systems Review of Systems  Unable to perform ROS: Mental status change     Physical Exam Updated Vital Signs BP (!) 103/40   Pulse (!) 121   Temp 99.3 F (37.4 C)   Resp 19   Ht 5\' 2"  (1.575 m)   Wt 68 kg   SpO2 100%   BMI 27.44 kg/m   Physical Exam  Constitutional: She appears well-developed. She  appears distressed.  HENT:  Head: Normocephalic.  Gurgling/congested  Eyes: No scleral icterus.  Minimally reactive pupils equal bilateral  Neck: Neck supple. No tracheal deviation present.  Cardiovascular: Tachycardia present.  Pulmonary/Chest: She is in respiratory distress.  Tachypnea, increased effort  Abdominal: She exhibits no distension.  Neurological: A cranial nerve deficit is present. GCS eye subscore is 1. GCS verbal subscore is 2. GCS motor subscore is 4.  No significant response to pain, general lethargy, general weakness  Skin: Capillary refill takes less than 2 seconds.  Psychiatric:  Unresponsive  Nursing note and vitals reviewed.    ED Treatments / Results  Labs (all labs ordered are  listed, but only abnormal results are displayed) Labs Reviewed  ACETAMINOPHEN LEVEL - Abnormal; Notable for the following components:      Result Value   Acetaminophen (Tylenol), Serum <10 (*)    All other components within normal limits  COMPREHENSIVE METABOLIC PANEL - Abnormal; Notable for the following components:   CO2 15 (*)    BUN 23 (*)    Creatinine, Ser 1.61 (*)    Calcium 8.6 (*)    AST 185 (*)    ALT 84 (*)    Total Bilirubin 1.3 (*)    GFR calc non Af Amer 36 (*)    GFR calc Af Amer 42 (*)    Anion gap 19 (*)    All other components within normal limits  URINALYSIS, ROUTINE W REFLEX MICROSCOPIC - Abnormal; Notable for the following components:   APPearance HAZY (*)    Hgb urine dipstick LARGE (*)    Ketones, ur 80 (*)    Protein, ur 30 (*)    Bacteria, UA RARE (*)    All other components within normal limits  CBC WITH DIFFERENTIAL/PLATELET - Abnormal; Notable for the following components:   Hemoglobin 16.5 (*)    HCT 50.2 (*)    MCV 100.2 (*)    All other components within normal limits  RAPID URINE DRUG SCREEN, HOSP PERFORMED - Abnormal; Notable for the following components:   Opiates POSITIVE (*)    Benzodiazepines POSITIVE (*)    Amphetamines POSITIVE (*)    All other components within normal limits  I-STAT CG4 LACTIC ACID, ED - Abnormal; Notable for the following components:   Lactic Acid, Venous 2.56 (*)    All other components within normal limits  I-STAT VENOUS BLOOD GAS, ED - Abnormal; Notable for the following components:   pCO2, Ven 36.0 (*)    pO2, Ven 95.0 (*)    Bicarbonate 17.6 (*)    TCO2 19 (*)    Acid-base deficit 8.0 (*)    All other components within normal limits  I-STAT ARTERIAL BLOOD GAS, ED - Abnormal; Notable for the following components:   pH, Arterial 7.087 (*)    pCO2 arterial 58.4 (*)    Bicarbonate 17.4 (*)    TCO2 19 (*)    Acid-base deficit 13.0 (*)    All other components within normal limits  I-STAT ARTERIAL BLOOD GAS,  ED - Abnormal; Notable for the following components:   pH, Arterial 7.209 (*)    Bicarbonate 15.4 (*)    TCO2 17 (*)    Acid-base deficit 12.0 (*)    All other components within normal limits  CULTURE, RESPIRATORY  CULTURE, BLOOD (ROUTINE X 2)  CULTURE, BLOOD (ROUTINE X 2)  ETHANOL  SALICYLATE LEVEL  MAGNESIUM  HIV ANTIBODY (ROUTINE TESTING W REFLEX)  BLOOD GAS, ARTERIAL  LACTIC ACID,  PLASMA  LACTIC ACID, PLASMA  PROCALCITONIN  PROCALCITONIN  COMPREHENSIVE METABOLIC PANEL  CBC  MAGNESIUM    EKG EKG Interpretation  Date/Time:  Sunday June 30 2018 17:48:15 EDT Ventricular Rate:  143 PR Interval:    QRS Duration: 80 QT Interval:  331 QTC Calculation: 511 R Axis:   119 Text Interpretation:  Sinus tachycardia Ventricular premature complex Right axis deviation Prolonged QT interval Confirmed by Blane Ohara [phone]) on 06/30/2018 6:09:24 PM   Radiology Dg Chest Portable 1 View  Result Date: 06/30/2018 CLINICAL DATA:  Altered mental status EXAM: PORTABLE CHEST 1 VIEW COMPARISON:  Report 09/05/2014 FINDINGS: Endotracheal tube tip is about 19 mm superior to the carina. Esophageal tube tip overlies the proximal stomach. Right-sided central venous catheter tip overlies the proximal right atrium. Streaky atelectasis at the left base. Dense consolidation in the right upper lobe with air bronchograms. Patchy infiltrate at the peripheral right lung base. Mildly dilated bowel in the upper abdomen. IMPRESSION: 1. Endotracheal tube tip about 19 mm superior to the carina. Esophageal tube tip overlies the proximal stomach 2. Dense right upper lobe consolidation concerning for a pneumonia. Additional mild infiltrate at the right lateral lung base. Electronically Signed   By: Jasmine Pang M.D.   On: 06/30/2018 19:18    Procedures .Critical Care Performed by: Blane Ohara, MD Authorized by: Blane Ohara, MD   Critical care provider statement:    Critical care time (minutes):  35    Critical care start time:  06/30/2018 6:00 PM   Critical care end time:  06/30/2018 6:35 PM   Critical care time was exclusive of:  Separately billable procedures and treating other patients and teaching time   Critical care was necessary to treat or prevent imminent or life-threatening deterioration of the following conditions:  Respiratory failure and toxidrome   Critical care was time spent personally by me on the following activities:  Discussions with consultants, evaluation of patient's response to treatment, examination of patient, ordering and performing treatments and interventions, ordering and review of laboratory studies, ordering and review of radiographic studies, pulse oximetry, re-evaluation of patient's condition, obtaining history from patient or surrogate and review of old charts   I assumed direction of critical care for this patient from another provider in my specialty: no    Procedure Name: Intubation Date/Time: 06/30/2018 6:36 PM Performed by: Blane Ohara, MD Pre-anesthesia Checklist: Patient identified, Emergency Drugs available, Suction available, Patient being monitored and Timeout performed Oxygen Delivery Method: Non-rebreather mask Preoxygenation: Pre-oxygenation with 100% oxygen Induction Type: Rapid sequence Ventilation: Mask ventilation without difficulty Laryngoscope Size: Glidescope and 3 Grade View: Grade I Tube size: 7.5 mm Number of attempts: 1 Airway Equipment and Method: Stylet Placement Confirmation: ETT inserted through vocal cords under direct vision,  Positive ETCO2,  CO2 detector and Breath sounds checked- equal and bilateral Tube secured with: ETT holder      (including critical care time)  Medications Ordered in ED Medications  propofol (DIPRIVAN) 1000 MG/100ML infusion (has no administration in time range)  heparin injection 5,000 Units (has no administration in time range)  pantoprazole sodium (PROTONIX) 40 mg/20 mL oral suspension 40  mg (has no administration in time range)  fentaNYL (SUBLIMAZE) injection 50 mcg (has no administration in time range)  fentaNYL (SUBLIMAZE) injection 50 mcg (has no administration in time range)  lactated ringers infusion (has no administration in time range)  piperacillin-tazobactam (ZOSYN) IVPB 3.375 g (has no administration in time range)  sodium chloride 0.9 % bolus 1,000  mL (0 mLs Intravenous Stopped 06/30/18 1831)  sodium chloride 0.9 % bolus 1,000 mL (0 mLs Intravenous Stopped 06/30/18 2105)  lactated ringers bolus 500 mL (0 mLs Intravenous Stopped 06/30/18 2045)  piperacillin-tazobactam (ZOSYN) IVPB 3.375 g (0 g Intravenous Stopped 06/30/18 2045)  rocuronium (ZEMURON) injection (100 mg Intravenous Given 06/30/18 1818)     Initial Impression / Assessment and Plan / ED Course  I have reviewed the triage vital signs and the nursing notes.  Pertinent labs & imaging results that were available during my care of the patient were reviewed by me and considered in my medical decision making (see chart for details).    Patient with unknown medical history and minimal details to recent events presents significant altered mental status and clinical concern for drug overdose with empty pill bottles at the bedside.  Patient tachycardic, tachypneic, concern for aspiration.  2 IVs placed immediately by nursing staff/EMS.  IV fluids given.  Patient placed on a monitor EKG performed QT prolonged we will monitor throughout ED stay.  Broad blood work and drug testing ordered.  Patient's heart rate and blood pressure improved with aggressive supportive care in the ER.  LFTs mild elevated.  Repeat fluid bolus ordered.  Unable to get a hold of family, patient's boyfriend and close friend arrived and gave further details on history of suicide attempt with drugs in the past.  The boyfriend and the patient did have a dispute and break-up prior to this event. Repeat EKG showed heart rate 118, sinus tachycardia,  borderline T wave abnormalities, normal QT Blood work showed leukocytosis, metabolic acidosis, pH approximately 7.1, mild LFT elevation.  Tylenol level and salicylate level negative.  Discussed with critical care for admission. The patients results and plan were reviewed and discussed.   Any x-rays performed were independently reviewed by myself.   Differential diagnosis were considered with the presenting HPI.  Medications  propofol (DIPRIVAN) 1000 MG/100ML infusion (has no administration in time range)  heparin injection 5,000 Units (has no administration in time range)  pantoprazole sodium (PROTONIX) 40 mg/20 mL oral suspension 40 mg (has no administration in time range)  fentaNYL (SUBLIMAZE) injection 50 mcg (has no administration in time range)  fentaNYL (SUBLIMAZE) injection 50 mcg (has no administration in time range)  lactated ringers infusion (has no administration in time range)  piperacillin-tazobactam (ZOSYN) IVPB 3.375 g (has no administration in time range)  sodium chloride 0.9 % bolus 1,000 mL (0 mLs Intravenous Stopped 06/30/18 1831)  sodium chloride 0.9 % bolus 1,000 mL (0 mLs Intravenous Stopped 06/30/18 2105)  lactated ringers bolus 500 mL (0 mLs Intravenous Stopped 06/30/18 2045)  piperacillin-tazobactam (ZOSYN) IVPB 3.375 g (0 g Intravenous Stopped 06/30/18 2045)  rocuronium (ZEMURON) injection (100 mg Intravenous Given 06/30/18 1818)    Vitals:   06/30/18 2115 06/30/18 2130 06/30/18 2145 06/30/18 2213  BP:  (!) 109/59 118/69 (!) 103/40  Pulse: (!) 118 (!) 116 (!) 117 (!) 121  Resp: 17 (!) 26 17 19   Temp: 99 F (37.2 C) 99.1 F (37.3 C) 99.3 F (37.4 C)   SpO2: 100% 100% 100% 100%  Weight:      Height:        Final diagnoses:  Intentional drug overdose, initial encounter (HCC)  Aspiration pneumonia of right upper lobe due to vomit (HCC)  Metabolic acidosis  Prolonged Q-T interval on ECG  Acute respiratory failure with hypercapnia (HCC)    Admission/  observation were discussed with the admitting physician, patient and/or family and  they are comfortable with the plan.   Final Clinical Impressions(s) / ED Diagnoses   Final diagnoses:  Intentional drug overdose, initial encounter [facility])  Aspiration pneumonia of right upper lobe due to vomit (HCC)  Metabolic acidosis  Prolonged Q-T interval on ECG  Acute respiratory failure with hypercapnia [facility])    ED Discharge Orders    None       Blane Ohara, MD 06/30/18 2217    Blane Ohara, MD 06/30/18 2219

## 2018-07-01 ENCOUNTER — Inpatient Hospital Stay (HOSPITAL_COMMUNITY): Payer: Medicare Other

## 2018-07-01 DIAGNOSIS — R9431 Abnormal electrocardiogram [ECG] [EKG]: Secondary | ICD-10-CM

## 2018-07-01 DIAGNOSIS — E872 Acidosis, unspecified: Secondary | ICD-10-CM

## 2018-07-01 DIAGNOSIS — J181 Lobar pneumonia, unspecified organism: Secondary | ICD-10-CM

## 2018-07-01 DIAGNOSIS — J189 Pneumonia, unspecified organism: Secondary | ICD-10-CM

## 2018-07-01 DIAGNOSIS — J9602 Acute respiratory failure with hypercapnia: Secondary | ICD-10-CM

## 2018-07-01 LAB — COMPREHENSIVE METABOLIC PANEL
ALBUMIN: 2.5 g/dL — AB (ref 3.5–5.0)
ALBUMIN: 2.5 g/dL — AB (ref 3.5–5.0)
ALT: 66 U/L — AB (ref 0–44)
ALT: 66 U/L — ABNORMAL HIGH (ref 0–44)
ANION GAP: 11 (ref 5–15)
AST: 142 U/L — AB (ref 15–41)
AST: 144 U/L — ABNORMAL HIGH (ref 15–41)
Alkaline Phosphatase: 40 U/L (ref 38–126)
Alkaline Phosphatase: 41 U/L (ref 38–126)
Anion gap: 11 (ref 5–15)
BUN: 18 mg/dL (ref 6–20)
BUN: 18 mg/dL (ref 6–20)
CHLORIDE: 111 mmol/L (ref 98–111)
CO2: 16 mmol/L — ABNORMAL LOW (ref 22–32)
CO2: 16 mmol/L — ABNORMAL LOW (ref 22–32)
Calcium: 6.8 mg/dL — ABNORMAL LOW (ref 8.9–10.3)
Calcium: 6.9 mg/dL — ABNORMAL LOW (ref 8.9–10.3)
Chloride: 110 mmol/L (ref 98–111)
Creatinine, Ser: 0.96 mg/dL (ref 0.44–1.00)
Creatinine, Ser: 0.99 mg/dL (ref 0.44–1.00)
GFR calc Af Amer: >60 mL/min (ref >60)
GFR calc Af Amer: >60 mL/min (ref >60)
GFR calc non Af Amer: >60 mL/min (ref >60)
GFR calc non Af Amer: >60 mL/min (ref >60)
Glucose, Bld: 147 mg/dL — ABNORMAL HIGH (ref 70–99)
Glucose, Bld: 151 mg/dL — ABNORMAL HIGH (ref 70–99)
POTASSIUM: 4.1 mmol/L (ref 3.5–5.1)
Potassium: 4 mmol/L (ref 3.5–5.1)
SODIUM: 137 mmol/L (ref 135–145)
SODIUM: 138 mmol/L (ref 135–145)
TOTAL PROTEIN: 4.6 g/dL — AB (ref 6.5–8.1)
TOTAL PROTEIN: 4.9 g/dL — AB (ref 6.5–8.1)
Total Bilirubin: 1.3 mg/dL — ABNORMAL HIGH (ref 0.3–1.2)
Total Bilirubin: 1.3 mg/dL — ABNORMAL HIGH (ref 0.3–1.2)

## 2018-07-01 LAB — GLUCOSE, CAPILLARY
GLUCOSE-CAPILLARY: 133 mg/dL — AB (ref 70–99)
Glucose-Capillary: 135 mg/dL — ABNORMAL HIGH (ref 70–99)
Glucose-Capillary: 116 mg/dL — ABNORMAL HIGH (ref 70–99)
Glucose-Capillary: 87 mg/dL (ref 70–99)

## 2018-07-01 LAB — CBC
HEMATOCRIT: 39 % (ref 36.0–46.0)
HEMOGLOBIN: 12.9 g/dL (ref 12.0–15.0)
MCH: 33.4 pg (ref 26.0–34.0)
MCHC: 33.1 g/dL (ref 30.0–36.0)
MCV: 101 fL — ABNORMAL HIGH (ref 78.0–100.0)
Platelets: 175 10*3/uL (ref 150–400)
RBC: 3.86 MIL/uL — ABNORMAL LOW (ref 3.87–5.11)
RDW: 12.2 % (ref 11.5–15.5)
WBC: 7.2 10*3/uL (ref 4.0–10.5)

## 2018-07-01 LAB — POCT I-STAT 3, ART BLOOD GAS (G3+)
Acid-base deficit: 9 mmol/L — ABNORMAL HIGH (ref 0.0–2.0)
Bicarbonate: 16.8 mmol/L — ABNORMAL LOW (ref 20.0–28.0)
O2 SAT: 99 %
PH ART: 7.269 — AB (ref 7.350–7.450)
TCO2: 18 mmol/L — ABNORMAL LOW (ref 22–32)
pCO2 arterial: 37 mmHg (ref 32.0–48.0)
pO2, Arterial: 136 mmHg — ABNORMAL HIGH (ref 83.0–108.0)

## 2018-07-01 LAB — HIV ANTIBODY (ROUTINE TESTING W REFLEX): HIV Screen 4th Generation wRfx: NONREACTIVE

## 2018-07-01 LAB — PROTIME-INR
INR: 1.12
Prothrombin Time: 14.3 seconds (ref 11.4–15.2)

## 2018-07-01 LAB — MAGNESIUM: Magnesium: 1.8 mg/dL (ref 1.7–2.4)

## 2018-07-01 LAB — MRSA PCR SCREENING: MRSA BY PCR: NEGATIVE

## 2018-07-01 LAB — CK: CK TOTAL: 11528 U/L — AB (ref 38–234)

## 2018-07-01 LAB — PROCALCITONIN: Procalcitonin: 26.26 ng/mL

## 2018-07-01 MED ORDER — MAGNESIUM SULFATE 2 GM/50ML IV SOLN
2.0000 g | Freq: Once | INTRAVENOUS | Status: AC
  Administered 2018-07-01: 2 g via INTRAVENOUS
  Filled 2018-07-01: qty 50

## 2018-07-01 MED ORDER — CHLORHEXIDINE GLUCONATE 0.12 % MT SOLN: 15.0000 mL | Freq: Two times a day (BID) | OROMUCOSAL | Status: DC

## 2018-07-01 MED ORDER — SODIUM CHLORIDE 0.9 % IV SOLN
1.0000 g | Freq: Once | INTRAVENOUS | Status: DC
  Filled 2018-07-01: qty 10

## 2018-07-01 MED ORDER — SODIUM BICARBONATE 8.4 % IV SOLN
50.0000 meq | Freq: Once | INTRAVENOUS | Status: AC
  Administered 2018-07-01: 50 meq via INTRAVENOUS
  Filled 2018-07-01: qty 50

## 2018-07-01 MED ORDER — CHLORHEXIDINE GLUCONATE 0.12% ORAL RINSE (MEDLINE KIT)
15.0000 mL | Freq: Two times a day (BID) | OROMUCOSAL | Status: DC
  Administered 2018-07-01 (×2): 15 mL via OROMUCOSAL

## 2018-07-01 MED ORDER — VANCOMYCIN HCL IN DEXTROSE 750-5 MG/150ML-% IV SOLN
750.0000 mg | Freq: Two times a day (BID) | INTRAVENOUS | Status: DC
  Administered 2018-07-01 – 2018-07-03 (×4): 750 mg via INTRAVENOUS
  Filled 2018-07-01 (×5): qty 150

## 2018-07-01 MED ORDER — LACTATED RINGERS IV BOLUS
2000.0000 mL | Freq: Once | INTRAVENOUS | Status: AC
  Administered 2018-07-01: 2000 mL via INTRAVENOUS

## 2018-07-01 MED ORDER — SODIUM CHLORIDE 0.9 % IV SOLN
1.0000 g | Freq: Once | INTRAVENOUS | Status: AC
  Administered 2018-07-01: 1 g via INTRAVENOUS
  Filled 2018-07-01: qty 10

## 2018-07-01 MED ORDER — SODIUM CHLORIDE 0.9 % IV SOLN: INTRAVENOUS | Status: DC | PRN

## 2018-07-01 MED ORDER — VANCOMYCIN HCL IN DEXTROSE 1-5 GM/200ML-% IV SOLN
1000.0000 mg | INTRAVENOUS | Status: DC
  Administered 2018-07-01: 1000 mg via INTRAVENOUS
  Filled 2018-07-01: qty 200

## 2018-07-01 MED ORDER — ORAL CARE MOUTH RINSE: 15.0000 mL | OROMUCOSAL | Status: DC

## 2018-07-01 MED ORDER — ORAL CARE MOUTH RINSE: 15.0000 mL | Freq: Two times a day (BID) | OROMUCOSAL | Status: DC

## 2018-07-01 NOTE — Progress Notes (Signed)
PCCM INTERVAL PROGRESS NOTE  Extubated. Tolerating well. Reports that she does not take benzos every day. She was not attempting to harm herself, just has bad pain. In discussion with boyfriend. He is not so sure that she wasn't trying to self harm. States she has been depressed recently.   Plan:  - Transfer to telemetry - Consult psychiatry  Joneen Roach, [facility] Pulmonary/Critical Care Pager [phone] or [phone]  07/01/2018 11:17 AM

## 2018-07-01 NOTE — Procedures (Signed)
Arterial Catheter Insertion Procedure Note Kim Massey [id number] April 07, 1966  Procedure: Insertion of Arterial Catheter  Indications: Blood pressure monitoring  Procedure Details Consent: Risks of procedure as well as the alternatives and risks of each were explained to the (patient/caregiver).  Consent for procedure obtained. and Unable to obtain consent because of emergent medical necessity. Time Out: Verified patient identification, verified procedure, site/side was marked, verified correct patient position, special equipment/implants available, medications/allergies/relevent history reviewed, required imaging and test results available.  Performed  Maximum sterile technique was used including antiseptics, cap, gloves, gown, hand hygiene, mask and sheet. Skin prep: Chlorhexidine; local anesthetic administered 20 gauge catheter was inserted into left radial artery using the Seldinger technique. ULTRASOUND GUIDANCE USED: NO Evaluation Blood flow good; BP tracing good. Complications: No apparent complications.   Kim Massey 07/01/2018

## 2018-07-01 NOTE — Procedures (Signed)
Extubation Procedure Note  Patient Details:   Name: LIZBETTE WHITCOMB DOB: Jul 13, 1966 MRN: [id number]   Airway Documentation:    Vent end date: 07/01/18 Vent end time: 0915   Evaluation  O2 sats: stable throughout Complications: No apparent complications Patient did tolerate procedure well. Bilateral Breath Sounds: Diminished   Yes  Toula Moos 07/01/2018, 9:23 AM

## 2018-07-01 NOTE — Progress Notes (Signed)
Pharmacy Antibiotic Note  Kim Massey is a 52 y.o. female admitted on 06/30/2018 with overdose.  Pharmacy has been consulted for Vancomycin dosing for PNA. Already on Zosyn. WBC WNL. Noted renal dysfunction. LA improving.   Plan: Vancomycin 1000 mg IV q24h, increase dose if renal function improves Already on Zosyn Trend WBC, temp, renal function  F/U infectious work-up Drug levels as indicated  Height: 5\' 2"  (157.5 cm) Weight: 150 lb (68 kg) IBW/kg (Calculated) : 50.1  Temp (24hrs), Avg:99.1 F (37.3 C), Min:96.6 F (35.9 C), Max:99.9 F (37.7 C)  Recent Labs  Lab 06/30/18 1752 06/30/18 1815 06/30/18 2205  WBC 6.1  --   --   CREATININE 1.61*  --   --   LATICACIDVEN  --  2.56* 1.5    Estimated Creatinine Clearance: 37.4 mL/min (A) (by C-G formula based on SCr of 1.61 mg/dL (H)).    Allergies  Allergen Reactions  . Nsaids Other (See Comments)    Due to crohn's disease  . Propoxyphene Nausea And Vomiting  . Aspirin Other (See Comments)    bleeding   Abran Duke, PharmD, BCPS Clinical Pharmacist Phone: [phone]

## 2018-07-01 NOTE — Progress Notes (Signed)
NAME:  Kim Massey, MRN:  [id number], DOB:  [birth year], LOS: 1 ADMISSION DATE:  06/30/2018, CONSULTATION DATE:  06/30/18 REFERRING MD:  Jodi Mourning MD- EDP, CHIEF COMPLAINT:  Overdose   Brief History   52 yr old female with PMHx of Bipolar, HTN and CVID w/ suspected overdose ( found next to empty bottles of clonazepam and Flexeril) Utox Positive for Opiates, Amphetamines and Benzodiazepines. Intubated for airway protection PCCM consulted for admission.   Past Medical History( per Care Everywhere and EMR)  Bipolar, HTN, Osteoporosis, CVID Dx 2009  Significant Hospital Events   9/29: Presented w/ OD, Intubated 9/30 more awake  Consults: date of consult/date signed off & final recs:  None to date   Procedures (surgical and bedside):  Intubated 9/30 >  Significant Diagnostic Tests:  Utox + Benzodiazepines, Opiates and Amphetamines  Micro Data:  Blood 9/30 > Urine 9/30 > Sputum 9/30 >  Antimicrobials:  Zosyn IV 9/30 > Vancomycin 9/30 >  Subjective:  Per RN no acute issues overnight. Weaning well 5/5. Starting to wake up this AM.   Objective   Blood pressure 112/75, pulse (!) 111, temperature 99.7 F (37.6 C), resp. rate (!) 25, height 5\' 2"  (1.575 m), weight 66.1 kg, SpO2 98 %.    Vent Mode: PSV;CPAP FiO2 (%):  [40 %-100 %] 40 % Set Rate:  [14 bmp-18 bmp] 18 bmp Vt Set:  [400 mL] 400 mL PEEP:  [5 cmH20] 5 cmH20 Pressure Support:  [5 cmH20] 5 cmH20 Plateau Pressure:  [12 cmH20-18 cmH20] 15 cmH20   Intake/Output Summary (Last 24 hours) at 07/01/2018 0902 Last data filed at 07/01/2018 0800 Gross per 24 hour  Intake 5402.22 ml  Output 1575 ml  Net 3827.22 ml   Filed Weights   06/30/18 2014 07/01/18 0036 07/01/18 0248  Weight: 68 kg 66.1 kg 66.1 kg    Examination: General:  Middle aged female on vent in NAD Neuro:  Arousable to voice. Follows commands. Non-focal.  HEENT:  [place]/AT, No JVD noted, PERRL Cardiovascular:  RRR, no MRG. Traced pedal edema.  Lungs:  Clear  bilateral breath sounds Abdomen:  Soft, non-tender, non-distended Musculoskeletal:  No acute deformity or ROM limitation Skin:  Intact, MMM  Assessment & Plan:   Acute toxic metabolic encephalopathy secondary to intentional overdose with benzodiazepines, opiates and amphetamines with associated QT prolongation (477) and Respiratory depression requiring intubation. Weaning well and much more awake this AM - Extubate - Holding home baclofen, clonazepam, cyclobenzaprine, and gabapentin.   - Will need psychiatric evaluation once extubated.   CAP - in immunocompromised host (CVID). Dense right upper lobe consolidation concerning for a pneumonia, likely aspiration given overdose and initial obtundation. Additional mild infiltrate at the right lateral lung base. - Continue zosyn and vancomycin in immunocompromised patient. Low threshold to DV vanco by tomorrow if continues to improve.  - Follow cultures.   AKI improved. Secondary to rhabdomyolysis and hypovolemia - IVF - Repeat BMP  Hypocalcemia (corrected 8.1) - 1 G calcium gluconate.  Anion Gap Metabolic Acidosis- AG 19: likely related to lactic acid and renal failure initially. Improved - continue IVF - Supportive care.   Bipolar Disorder Type 1 - h/o a single manic episode coupled with major Depression ( recently broke up with her significant other which may have been precitipus for this current OD attempt) normally controlled with Duloxetine 90mg  daily per July 2019 last PCP visit and Topamax 50 mg daily.  - Holding home medications for now.  GERD  IBS  Currently taking Questran, Colestid and Bentyl. Still reports diarrhea. Continues Protonix 40 mg daily. Last colonoscopy was 09/2017 and she had 8 polyps removed which were benign.  - Continue home Protonix once taking PO    Disposition / Summary of Today's Plan 07/01/18   Extubate today Continue IVF Repeat labs tomorrow If looks good can transfer out this afternoon Safety sitter  to bedside Will need psych eval.     Diet: NPO - RN to advance once extubated. Pain/Anxiety/Delirium protocol (if indicated): N/a VAP protocol (if indicated): yes DVT prophylaxis: SCDs and Hep [place] GI prophylaxis: yes protonix Hyperglycemia protocol: if BG>180mg /dl will start ISS for goal 140-180 mg/dl Mobility: bedrest Code Status: Full code Family Communication: Patient updated. Boyfriend will be here later today.   Labs   CBC: Recent Labs  Lab 06/30/18 1752 07/01/18 0201  WBC 6.1 7.2  NEUTROABS 4.5  --   HGB 16.5* 12.9  HCT 50.2* 39.0  MCV 100.2* 101.0*  PLT 249 175    Basic Metabolic Panel: Recent Labs  Lab 06/30/18 1752 07/01/18 0201  NA 137 138  137  K 4.4 4.0  4.1  CL 103 111  110  CO2 15* 16*  16*  GLUCOSE 96 147*  151*  BUN 23* 18  18  CREATININE 1.61* 0.96  0.99  CALCIUM 8.6* 6.8*  6.9*  MG 2.0 1.8   GFR: Estimated Creatinine Clearance: 60 mL/min (by C-G formula based on SCr of 0.99 mg/dL). Recent Labs  Lab 06/30/18 1752 06/30/18 1815 06/30/18 2205 07/01/18 0201  PROCALCITON  --   --  24.13 26.26  WBC 6.1  --   --  7.2  LATICACIDVEN  --  2.56* 1.5  --     Liver Function Tests: Recent Labs  Lab 06/30/18 1752 07/01/18 0201  AST 185* 142*  144*  ALT 84* 66*  66*  ALKPHOS 61 41  40  BILITOT 1.3* 1.3*  1.3*  PROT 6.5 4.6*  4.9*  ALBUMIN 3.7 2.5*  2.5*   No results for input(s): LIPASE, AMYLASE in the last 168 hours. No results for input(s): AMMONIA in the last 168 hours.  ABG    Component Value Date/Time   PHART 7.269 (L) 07/01/2018 0250   PCO2ART 37.0 07/01/2018 0250   PO2ART 136.0 (H) 07/01/2018 0250   HCO3 16.8 (L) 07/01/2018 0250   TCO2 18 (L) 07/01/2018 0250   ACIDBASEDEF 9.0 (H) 07/01/2018 0250   O2SAT 99.0 07/01/2018 0250     Coagulation Profile: No results for input(s): INR, PROTIME in the last 168 hours.  Cardiac Enzymes: Recent Labs  Lab 07/01/18 0201  CKTOTAL 11,528*    HbA1C: No results found  for: HGBA1C  CBG: Recent Labs  Lab 07/01/18 0418 07/01/18 0713  GLUCAP 133* 135*    Admitting History of Present Illness.   Per EMR and accounts of other providers as patient is acutely encephalopathic  52 yr old female with PMHx of Bipolar, HTN and CVID presented to [facility] on 9/29 brought in by [facility] from home- roommate found pt on floor agonal and cyanotic with two empty pill bottles around her (cyclobenzaprine and clonazepam). Pt given 2mg  narcan IN by fire with some improvement. Pt responding only to painful stimuli on arrival,    Per EDP documentation: no family at bedside (Chaplain spoke to boyfriend who stated that she had family but he didn't think they would come to the hospital) presents with EMS after being found by reported roommate unresponsive with empty pill  bottles at the bedside.  They found empty bottles of clonazepam and Flexeril.  No other drugs known.  Patient was unresponsive for EMS on arrival and continued on route.  Unable to get details.  On my evaluation: pt withdraws from painful stimuli and opens her eyes to her name. Remains intubated hemodynamically stable  Past Medical History  She,  has no past medical history on file.   Surgical History   none  Social History   Social History   Socioeconomic History  . Marital status: Unknown    Spouse name: Not on file  . Number of children: Not on file  . Years of education: Not on file  . Highest education level: Not on file  Occupational History  . Not on file  Social Needs  . Financial resource strain: Not on file  . Food insecurity:    Worry: Not on file    Inability: Not on file  . Transportation needs:    Medical: Not on file    Non-medical: Not on file  Tobacco Use  . Smoking status: Not on file  Substance and Sexual Activity  . Alcohol use: Not on file  . Drug use: Not on file  . Sexual activity: Not on file  Lifestyle  . Physical activity:    Days per week: Not on file    Minutes per  session: Not on file  . Stress: Not on file  Relationships  . Social connections:    Talks on phone: Not on file    Gets together: Not on file    Attends religious service: Not on file    Active member of club or organization: Not on file    Attends meetings of clubs or organizations: Not on file    Relationship status: Not on file  . Intimate partner violence:    Fear of current or ex partner: Not on file    Emotionally abused: Not on file    Physically abused: Not on file    Forced sexual activity: Not on file  Other Topics Concern  . Not on file  Social History Narrative  . Not on file  ,     Family History   Her family history is not on file.   Allergies Allergies  Allergen Reactions  . Nsaids Other (See Comments)    Due to crohn's disease  . Propoxyphene Nausea And Vomiting  . Aspirin Other (See Comments)    bleeding     Home Medications  Prior to Admission medications   Medication Sig Start Date End Date Taking? Authorizing Provider  acetaminophen (TYLENOL) 500 MG tablet Take 1,000 mg by mouth every 4 (four) hours as needed for headache (pain).   Yes [provider]  baclofen (LIORESAL) 10 MG tablet Take 10 mg by mouth 3 (three) times daily. 06/26/18  Yes [provider]  clonazePAM (KLONOPIN) 0.5 MG tablet Take 0.5 mg by mouth 2 (two) times daily as needed for anxiety.  06/18/18  Yes [provider]  cyclobenzaprine (FLEXERIL) 5 MG tablet Take 5 mg by mouth 2 (two) times daily. 06/25/18  Yes [provider]  DULoxetine (CYMBALTA) 60 MG capsule Take 60 mg by mouth daily. 04/09/18  Yes [provider]  HYDROcodone-acetaminophen (NORCO/VICODIN) 5-325 MG tablet Take 1 tablet by mouth every 8 (eight) hours as needed (pain).  06/26/18  Yes [provider]  loratadine (CLARITIN) 10 MG tablet Take 10 mg by mouth daily as needed for allergies.  Yes [provider]  pantoprazole (PROTONIX) 40 MG tablet Take 40 mg by  mouth daily.   Yes [provider]  gabapentin (NEURONTIN) 100 MG capsule Take 100 mg by mouth See admin instructions. Start date 06/18/18: take 1 capsule (100 mg) by mouth at bedtime for 4 days, then take 1 capsule (100 mg) twice daily for 4 days, then take 1 capsule (100 mg) three times daily. 06/18/18   [provider]  gabapentin (NEURONTIN) 300 MG capsule Take 300 mg by mouth See admin instructions. Start date 06/18/18: take 1 capsule (100 mg) by mouth at bedtime for 4 days, then take 1 capsule (100 mg) twice daily for 4 days, then take 1 capsule (100 mg) three times daily. 06/18/18   [provider]     My critical care time excluding procedures: 24 minutes   Joneen Roach, AGACNP-BC [facility] Pulmonary/Critical Care Pager [phone] or [phone]  07/01/2018 9:25 AM

## 2018-07-01 NOTE — Progress Notes (Signed)
Pharmacy Antibiotic Note  Kim Massey is a 52 y.o. female admitted on 06/30/2018 with overdose.  Pharmacy has been consulted for Vancomycin and Zosyn dosing for aspiration pneumonia. Renal function has improved significantly since admission (CrCl now ~47ml/min). WBC trended up but still WNL, slightly febrile at 100.0.   Plan: Increase Vancomycin to 750 mg IV q12h Continue Zosyn 3.375g IV q8h infused over 4 hours Monitor clinical picture, renal function, VT at steady state F/U C&S, abx de-escalation, LOT   Height: 5\' 2"  (157.5 cm) Weight: 145 lb 11.6 oz (66.1 kg) IBW/kg (Calculated) : 50.1  Temp (24hrs), Avg:99.3 F (37.4 C), Min:96.6 F (35.9 C), Max:100 F (37.8 C)  Recent Labs  Lab 06/30/18 1752 06/30/18 1815 06/30/18 2205 07/01/18 0201  WBC 6.1  --   --  7.2  CREATININE 1.61*  --   --  0.96  0.99  LATICACIDVEN  --  2.56* 1.5  --     Estimated Creatinine Clearance: 60 mL/min (by C-G formula based on SCr of 0.99 mg/dL).    Allergies  Allergen Reactions  . Nsaids Other (See Comments)    Due to crohn's disease  . Propoxyphene Nausea And Vomiting  . Aspirin Other (See Comments)    bleeding    Harlow Mares, PharmD PGY1 Pharmacy Resident Phone [phone]  07/01/2018   6:31 AM

## 2018-07-01 NOTE — Progress Notes (Signed)
With verbal order from Joneen Roach, CCMNP-Left Radial Arterial Line Removed from pt without any complications. Pt no longer needs arterial BP monitoring at this time.

## 2018-07-02 ENCOUNTER — Ambulatory Visit (HOSPITAL_COMMUNITY): Payer: Medicare Other

## 2018-07-02 ENCOUNTER — Inpatient Hospital Stay (HOSPITAL_COMMUNITY): Payer: Medicare Other

## 2018-07-02 DIAGNOSIS — R7401 Elevation of levels of liver transaminase levels: Secondary | ICD-10-CM | POA: Diagnosis present

## 2018-07-02 DIAGNOSIS — T1491XA Suicide attempt, initial encounter: Secondary | ICD-10-CM

## 2018-07-02 DIAGNOSIS — Y92009 Unspecified place in unspecified non-institutional (private) residence as the place of occurrence of the external cause: Secondary | ICD-10-CM

## 2018-07-02 DIAGNOSIS — R740 Nonspecific elevation of levels of transaminase and lactic acid dehydrogenase [LDH]: Secondary | ICD-10-CM

## 2018-07-02 DIAGNOSIS — W19XXXA Unspecified fall, initial encounter: Secondary | ICD-10-CM

## 2018-07-02 DIAGNOSIS — G9341 Metabolic encephalopathy: Secondary | ICD-10-CM | POA: Diagnosis present

## 2018-07-02 DIAGNOSIS — M6282 Rhabdomyolysis: Secondary | ICD-10-CM | POA: Diagnosis present

## 2018-07-02 DIAGNOSIS — J690 Pneumonitis due to inhalation of food and vomit: Secondary | ICD-10-CM

## 2018-07-02 DIAGNOSIS — T481X2A Poisoning by skeletal muscle relaxants [neuromuscular blocking agents], intentional self-harm, initial encounter: Principal | ICD-10-CM

## 2018-07-02 DIAGNOSIS — T424X2A Poisoning by benzodiazepines, intentional self-harm, initial encounter: Secondary | ICD-10-CM

## 2018-07-02 HISTORY — PX: FACET JOINT INJECTION: SHX5016

## 2018-07-02 LAB — CBC
HCT: 32.6 % — ABNORMAL LOW (ref 36.0–46.0)
Hemoglobin: 10.7 g/dL — ABNORMAL LOW (ref 12.0–15.0)
MCH: 33.1 pg (ref 26.0–34.0)
MCHC: 32.8 g/dL (ref 30.0–36.0)
MCV: 100.9 fL — ABNORMAL HIGH (ref 78.0–100.0)
Platelets: 155 10*3/uL (ref 150–400)
RBC: 3.23 MIL/uL — ABNORMAL LOW (ref 3.87–5.11)
RDW: 12 % (ref 11.5–15.5)
WBC: 8.8 10*3/uL (ref 4.0–10.5)

## 2018-07-02 LAB — HEPATIC FUNCTION PANEL
ALBUMIN: 2.3 g/dL — AB (ref 3.5–5.0)
ALT: 76 U/L — ABNORMAL HIGH (ref 0–44)
AST: 176 U/L — ABNORMAL HIGH (ref 15–41)
Alkaline Phosphatase: 47 U/L (ref 38–126)
BILIRUBIN TOTAL: 0.5 mg/dL (ref 0.3–1.2)
Bilirubin, Direct: 0.2 mg/dL (ref 0.0–0.2)
Indirect Bilirubin: 0.3 mg/dL (ref 0.3–0.9)
TOTAL PROTEIN: 4.9 g/dL — AB (ref 6.5–8.1)

## 2018-07-02 LAB — BASIC METABOLIC PANEL
Anion gap: 8 (ref 5–15)
BUN: 6 mg/dL (ref 6–20)
CALCIUM: 8.5 mg/dL — AB (ref 8.9–10.3)
CO2: 23 mmol/L (ref 22–32)
Chloride: 109 mmol/L (ref 98–111)
Creatinine, Ser: 0.66 mg/dL (ref 0.44–1.00)
GFR calc Af Amer: >60 mL/min (ref >60)
Glucose, Bld: 110 mg/dL — ABNORMAL HIGH (ref 70–99)
Potassium: 3.5 mmol/L (ref 3.5–5.1)
Sodium: 140 mmol/L (ref 135–145)

## 2018-07-02 LAB — URINE CULTURE: Culture: NO GROWTH

## 2018-07-02 LAB — GLUCOSE, CAPILLARY
GLUCOSE-CAPILLARY: 123 mg/dL — AB (ref 70–99)
GLUCOSE-CAPILLARY: 103 mg/dL — AB (ref 70–99)

## 2018-07-02 LAB — PROCALCITONIN: Procalcitonin: 8.25 ng/mL

## 2018-07-02 LAB — CK: CK TOTAL: 8407 U/L — AB (ref 38–234)

## 2018-07-02 MED ORDER — ACETAMINOPHEN 325 MG PO TABS
650.0000 mg | ORAL_TABLET | ORAL | Status: DC | PRN
  Administered 2018-07-02 – 2018-07-03 (×4): 650 mg via ORAL
  Filled 2018-07-02 (×4): qty 2

## 2018-07-02 MED ORDER — LIDOCAINE 5 % EX PTCH
1.0000 | MEDICATED_PATCH | CUTANEOUS | Status: DC
  Administered 2018-07-02 – 2018-07-04 (×3): 1 via TRANSDERMAL
  Filled 2018-07-02 (×3): qty 1

## 2018-07-02 MED ORDER — SODIUM CHLORIDE 0.9 % IV SOLN
INTRAVENOUS | Status: DC
  Administered 2018-07-02 – 2018-07-04 (×4): via INTRAVENOUS

## 2018-07-02 MED ORDER — OXYCODONE HCL 5 MG PO TABS
5.0000 mg | ORAL_TABLET | ORAL | Status: DC | PRN
  Administered 2018-07-02 – 2018-07-04 (×6): 5 mg via ORAL
  Filled 2018-07-02 (×7): qty 1

## 2018-07-02 NOTE — Consult Note (Signed)
Kim Massey   Reason for Massey:  Suicide attempt  Referring Physician:  Dr. Tana Coast Patient Identification: Kim Massey MRN:  [id number] Principal Diagnosis: Overdose Diagnosis:   Patient Active Problem List   Diagnosis Date Noted  . Rhabdomyolysis [M62.82] 07/02/2018  . Fall at home, initial encounter [W19.Merril Abbe, V94.801] 07/02/2018  . Acute metabolic encephalopathy [K55.37] 07/02/2018  . Transaminitis [R74.0] 07/02/2018  . Prolonged Q-T interval on ECG [R94.31]   . Metabolic acidosis [S82.7]   . Acute respiratory failure with hypercapnia ([place]) [J96.02]   . Pneumonia of right upper lobe due to infectious organism ([place]) [J18.1]   . Overdose [T50.901A] 06/30/2018  . STYE [M78.675] 11/04/2010  . URI [J06.9] 11/04/2010  . BICEPS TENDINITIS [M75.20] 11/04/2010    Total Time spent with patient: 1 hour  Subjective:   Kim Massey is a 52 y.o. female patient admitted with suicide attempt by overdose with Klonopin and Flexeril.  HPI:   Per chart review, patient was found unresponsive by her roommate with empty pill bottles of Klonopin and Fexeril at bedside. She was intubated for airway protection and admitted to the ICU. UDS was positive for benzodiazepines, opiates and amphetamines and BAL was negative on admission. She has a history of BPAD. She denies SI and reports that she was taking these medications for pain. Her boyfriend is unsure if she was trying to harm herself. He reports that she has been depressed. Home medications include Klonopin 0.5 mg BID PRN, Cymbalta 60 mg daily and Gabapentin 100 mg TID. She last filled Klonopin on 9/17 by [facility].   On interview, Kim Massey reports that she is not depressed and denies attempting suicide.  She reports that she took up to 5 tablets of Klonopin and Flexeril for hip and back pain.  She reports a history of bulging discs (T1-T5) and osteoporosis.  She has been taking pain medications which have not  helped with her pain since July.  She planned to get injections today.  She reports a history of depression in the past although she denies current symptoms.  She reports taking Cymbalta for chronic pain since 2014.  She denies problems with anxiety at this time.  She later reports that she was dealing with some worries about a purchase her fianc made last weekend but they have spoken about it and she feels better.  She did not elaborate on the situation.  She takes Klonopin for anxiety and restless legs.  She reports using Klonopin sparingly and does not require it daily.  She was informed that her last refill was on 9/17 per PMP and therefore there was a discrepancy in the amount of pills that should have been remaining in her bottle after ingestion.  She then reported that she flushed some pills down the toilet on Saturday but was unable to explain why.  She denies a history of manic symptoms (decreased need for sleep, increased energy, pressured speech or euphoria).  She denies problems with sleep or appetite.  She denies access to guns weapons at home.  Patient's fianc was present at bedside with her permission.  He reports that he does not have any concerns for her safety although per medical record family reported she has been severely depressed and there was concern that she would harm herself.  He reports going to check on her Sunday morning in the guest bedroom and she was sleep.  He reports checking on her again in the afternoon and he heard her making  gurgling sounds so he called her friend who advised him to call EMS.  He reports that it is not uncommon for them to sleep separately since he snores.   Past Psychiatric History: BPAD  Risk to Self:  Yes given drug overdose.  Risk to Others:  None. Denies HI. Prior Inpatient Therapy:  Denies  Prior Outpatient Therapy:  Denies   Past Medical History: No past medical history on file. The histories are not reviewed yet. Please review them in the  "History" navigator section and refresh this [place]. Family History: No family history on file. Family Psychiatric  History: Brother-BPAD.  Social History:  Social History   Substance and Sexual Activity  Alcohol Use Not on file     Social History   Substance and Sexual Activity  Drug Use Not on file    Social History   Socioeconomic History  . Marital status: Unknown    Spouse name: Not on file  . Number of children: Not on file  . Years of education: Not on file  . Highest education level: Not on file  Occupational History  . Not on file  Social Needs  . Financial resource strain: Not on file  . Food insecurity:    Worry: Not on file    Inability: Not on file  . Transportation needs:    Medical: Not on file    Non-medical: Not on file  Tobacco Use  . Smoking status: Not on file  Substance and Sexual Activity  . Alcohol use: Not on file  . Drug use: Not on file  . Sexual activity: Not on file  Lifestyle  . Physical activity:    Days per week: Not on file    Minutes per session: Not on file  . Stress: Not on file  Relationships  . Social connections:    Talks on phone: Not on file    Gets together: Not on file    Attends religious service: Not on file    Active member of club or organization: Not on file    Attends meetings of clubs or organizations: Not on file    Relationship status: Not on file  Other Topics Concern  . Not on file  Social History Narrative  . Not on file   Additional Social History: She lives at home with her fiance. She has one adult daughter and 2 adult stepsons. She is unemployed. She receives disability. She reports social alcohol use. She denies illicit substance use although UDS was positive for amphetamines.     Allergies:   Allergies  Allergen Reactions  . Nsaids Other (See Comments)    Due to crohn's disease  . Propoxyphene Nausea And Vomiting  . Aspirin Other (See Comments)    bleeding    Labs:  Results for orders  placed or performed during the hospital encounter of 06/30/18 (from the past 48 hour(s))  Acetaminophen level     Status: Abnormal   Collection Time: 06/30/18  5:52 PM  Result Value Ref Range   Acetaminophen (Tylenol), Serum <10 (L) 10 - 30 ug/mL    Comment: Performed at [facility], [place] [address] [place], [place] [address]  Comprehensive metabolic panel     Status: Abnormal   Collection Time: 06/30/18  5:52 PM  Result Value Ref Range   Sodium 137 135 - 145 mmol/L   Potassium 4.4 3.5 - 5.1 mmol/L   Chloride 103 98 - 111 mmol/L   CO2 15 (L) 22 -  32 mmol/L   Glucose, Bld 96 70 - 99 mg/dL   BUN 23 (H) 6 - 20 mg/dL   Creatinine, Ser 1.61 (H) 0.44 - 1.00 mg/dL   Calcium 8.6 (L) 8.9 - 10.3 mg/dL   Total Protein 6.5 6.5 - 8.1 g/dL   Albumin 3.7 3.5 - 5.0 g/dL   AST 185 (H) 15 - 41 U/L   ALT 84 (H) 0 - 44 U/L   Alkaline Phosphatase 61 38 - 126 U/L   Total Bilirubin 1.3 (H) 0.3 - 1.2 mg/dL   GFR calc non Af Amer 36 (L) >60 mL/min   GFR calc Af Amer 42 (L) >60 mL/min    Comment: (NOTE) The eGFR has been calculated using the CKD EPI equation. This calculation has not been validated in all clinical situations. eGFR's persistently <60 mL/min signify possible Chronic Kidney Disease.    Anion gap 19 (H) 5 - 15    Comment: Performed at [facility], [place] [address], [place], [place] [address]  Ethanol     Status: None   Collection Time: 06/30/18  5:52 PM  Result Value Ref Range   Alcohol, Ethyl (B) <10 <10 mg/dL    Comment: Performed at [facility] [address] [place], [place] [address]  Salicylate level     Status: None   Collection Time: 06/30/18  5:52 PM  Result Value Ref Range   Salicylate Lvl <1.5 2.8 - 30.0 mg/dL    Comment: Performed at [facility] [address] [place], [place] [address]  CBC with Differential     Status: Abnormal   Collection Time: 06/30/18  5:52 PM  Result Value Ref Range   WBC 6.1 4.0 - 10.5 K/uL   RBC 5.01 3.87 - 5.11  MIL/uL   Hemoglobin 16.5 (H) 12.0 - 15.0 g/dL   HCT 50.2 (H) 36.0 - 46.0 %   MCV 100.2 (H) 78.0 - 100.0 fL   MCH 32.9 26.0 - 34.0 pg   MCHC 32.9 30.0 - 36.0 g/dL   RDW 11.9 11.5 - 15.5 %   Platelets 249 150 - 400 K/uL   Neutrophils Relative % 74 %   Lymphocytes Relative 15 %   Monocytes Relative 9 %   Eosinophils Relative 1 %   Basophils Relative 1 %   Neutro Abs 4.5 1.7 - 7.7 K/uL   Lymphs Abs 0.9 0.7 - 4.0 K/uL   Monocytes Absolute 0.5 0.1 - 1.0 K/uL   Eosinophils Absolute 0.1 0.0 - 0.7 K/uL   Basophils Absolute 0.1 0.0 - 0.1 K/uL    Comment: Performed at [facility] [address] [place], [place] [address]  Magnesium     Status: None   Collection Time: 06/30/18  5:52 PM  Result Value Ref Range   Magnesium 2.0 1.7 - 2.4 mg/dL    Comment: Performed at [facility], [place] [address], [place],  [address]  I-Stat CG4 Lactic Acid, ED     Status: Abnormal   Collection Time: 06/30/18  6:15 PM  Result Value Ref Range   Lactic Acid, Venous 2.56 (HH) 0.5 - 1.9 mmol/L   Comment NOTIFIED PHYSICIAN   I-Stat Venous Blood Gas, ED (order at [facility] and [facility] only)     Status: Abnormal   Collection Time: 06/30/18  6:15 PM  Result Value Ref Range   pH, Ven 7.297 7.250 - 7.430   pCO2, Ven 36.0 (L) 44.0 - 60.0 mmHg  pO2, Ven 95.0 (H) 32.0 - 45.0 mmHg   Bicarbonate 17.6 (L) 20.0 - 28.0 mmol/L   TCO2 19 (L) 22 - 32 mmol/L   O2 Saturation 97.0 %   Acid-base deficit 8.0 (H) 0.0 - 2.0 mmol/L   Patient temperature HIDE    Sample type VENOUS   Urinalysis, Routine w reflex microscopic     Status: Abnormal   Collection Time: 06/30/18  7:08 PM  Result Value Ref Range   Color, Urine YELLOW YELLOW   APPearance HAZY (A) CLEAR   Specific Gravity, Urine 1.019 1.005 - 1.030   pH 5.0 5.0 - 8.0   Glucose, UA NEGATIVE NEGATIVE mg/dL   Hgb urine dipstick LARGE (A) NEGATIVE   Bilirubin Urine NEGATIVE NEGATIVE   Ketones, ur 80 (A) NEGATIVE mg/dL   Protein, ur 30 (A) NEGATIVE mg/dL    Nitrite NEGATIVE NEGATIVE   Leukocytes, UA NEGATIVE NEGATIVE   RBC / HPF 0-5 0 - 5 RBC/hpf   WBC, UA 6-10 0 - 5 WBC/hpf   Bacteria, UA RARE (A) NONE SEEN   Squamous Epithelial / LPF 0-5 0 - 5   WBC Clumps PRESENT    Mucus PRESENT    Hyaline Casts, UA PRESENT     Comment: Performed at [facility], [address] [place], [place] [address]  Rapid urine drug screen (hospital performed)     Status: Abnormal   Collection Time: 06/30/18  7:08 PM  Result Value Ref Range   Opiates POSITIVE (A) NONE DETECTED   Cocaine NONE DETECTED NONE DETECTED   Benzodiazepines POSITIVE (A) NONE DETECTED   Amphetamines POSITIVE (A) NONE DETECTED   Tetrahydrocannabinol NONE DETECTED NONE DETECTED   Barbiturates NONE DETECTED NONE DETECTED    Comment: (NOTE) DRUG SCREEN FOR MEDICAL PURPOSES ONLY.  IF CONFIRMATION IS NEEDED FOR ANY PURPOSE, NOTIFY LAB WITHIN 5 DAYS. LOWEST DETECTABLE LIMITS FOR URINE DRUG SCREEN Drug Class                     Cutoff (ng/mL) Amphetamine and metabolites    1000 Barbiturate and metabolites    200 Benzodiazepine                 010 Tricyclics and metabolites     300 Opiates and metabolites        300 Cocaine and metabolites        300 THC                            50 Performed at [facility], [place] [address], [place], [place] [address]   I-Stat arterial blood gas, ED     Status: Abnormal   Collection Time: 06/30/18  7:39 PM  Result Value Ref Range   pH, Arterial 7.087 (LL) 7.350 - 7.450   pCO2 arterial 58.4 (H) 32.0 - 48.0 mmHg   pO2, Arterial 104.0 83.0 - 108.0 mmHg   Bicarbonate 17.4 (L) 20.0 - 28.0 mmol/L   TCO2 19 (L) 22 - 32 mmol/L   O2 Saturation 95.0 %   Acid-base deficit 13.0 (H) 0.0 - 2.0 mmol/L   Patient temperature 99.9 F    Collection site RADIAL, ALLEN'S TEST ACCEPTABLE    Drawn by Operator    Sample type ARTERIAL    Comment NOTIFIED PHYSICIAN   I-Stat arterial blood gas, ED     Status: Abnormal   Collection Time: 06/30/18  9:57 PM   Result  Value Ref Range   pH, Arterial 7.209 (L) 7.350 - 7.450   pCO2 arterial 38.9 32.0 - 48.0 mmHg   pO2, Arterial 107.0 83.0 - 108.0 mmHg   Bicarbonate 15.4 (L) 20.0 - 28.0 mmol/L   TCO2 17 (L) 22 - 32 mmol/L   O2 Saturation 97.0 %   Acid-base deficit 12.0 (H) 0.0 - 2.0 mmol/L   Patient temperature 37.4 C    Collection site RADIAL, ALLEN'S TEST ACCEPTABLE    Drawn by Operator    Sample type ARTERIAL   Culture, blood (routine x 2)     Status: None (Preliminary result)   Collection Time: 06/30/18 10:04 PM  Result Value Ref Range   Specimen Description BLOOD LEFT ANTECUBITAL    Special Requests      BOTTLES DRAWN AEROBIC AND ANAEROBIC Blood Culture adequate volume   Culture      NO GROWTH 2 DAYS Performed at [facility], [address], [place], [place] [address]    Report Status PENDING   HIV antibody (Routine Testing)     Status: None   Collection Time: 06/30/18 10:05 PM  Result Value Ref Range   HIV Screen 4th Generation wRfx Non Reactive Non Reactive    Comment: (NOTE) Performed At: [facility] [address] [place], [place] [id number] Rush Farmer MD KT:[id number]   Lactic acid, plasma     Status: None   Collection Time: 06/30/18 10:05 PM  Result Value Ref Range   Lactic Acid, Venous 1.5 0.5 - 1.9 mmol/L    Comment: Performed at [facility], [place] [address] [place],  [address]  Procalcitonin - Baseline     Status: None   Collection Time: 06/30/18 10:05 PM  Result Value Ref Range   Procalcitonin 24.13 ng/mL    Comment:        Interpretation: PCT >= 10 ng/mL: Important systemic inflammatory response, almost exclusively due to severe bacterial sepsis or septic shock. (NOTE)       Sepsis PCT Algorithm           Lower Respiratory Tract                                      Infection PCT Algorithm    ----------------------------     ----------------------------         PCT < 0.25 ng/mL                PCT < 0.10 ng/mL         Strongly  encourage             Strongly discourage   discontinuation of antibiotics    initiation of antibiotics    ----------------------------     -----------------------------       PCT 0.25 - 0.50 ng/mL            PCT 0.10 - 0.25 ng/mL               OR       >80% decrease in PCT            Discourage initiation of                                            antibiotics  Encourage discontinuation           of antibiotics    ----------------------------     -----------------------------         PCT >= 0.50 ng/mL              PCT 0.26 - 0.50 ng/mL                AND       <80% decrease in PCT             Encourage initiation of                                             antibiotics       Encourage continuation           of antibiotics    ----------------------------     -----------------------------        PCT >= 0.50 ng/mL                  PCT > 0.50 ng/mL               AND         increase in PCT                  Strongly encourage                                      initiation of antibiotics    Strongly encourage escalation           of antibiotics                                     -----------------------------                                           PCT <= 0.25 ng/mL                                                 OR                                        > 80% decrease in PCT                                     Discontinue / Do not initiate                                             antibiotics Performed at [facility], [place] [address] [place], [place] [address]   Procalcitonin     Status: None   Collection Time: 07/01/18  2:01 AM  Result Value Ref Range  Procalcitonin 26.26 ng/mL    Comment:        Interpretation: PCT >= 10 ng/mL: Important systemic inflammatory response, almost exclusively due to severe bacterial sepsis or septic shock. (NOTE)       Sepsis PCT Algorithm           Lower Respiratory Tract                                      Infection PCT  Algorithm    ----------------------------     ----------------------------         PCT < 0.25 ng/mL                PCT < 0.10 ng/mL         Strongly encourage             Strongly discourage   discontinuation of antibiotics    initiation of antibiotics    ----------------------------     -----------------------------       PCT 0.25 - 0.50 ng/mL            PCT 0.10 - 0.25 ng/mL               OR       >80% decrease in PCT            Discourage initiation of                                            antibiotics      Encourage discontinuation           of antibiotics    ----------------------------     -----------------------------         PCT >= 0.50 ng/mL              PCT 0.26 - 0.50 ng/mL                AND       <80% decrease in PCT             Encourage initiation of                                             antibiotics       Encourage continuation           of antibiotics    ----------------------------     -----------------------------        PCT >= 0.50 ng/mL                  PCT > 0.50 ng/mL               AND         increase in PCT                  Strongly encourage                                      initiation of antibiotics    Strongly encourage escalation           of antibiotics                                     -----------------------------  PCT <= 0.25 ng/mL                                                 OR                                        > 80% decrease in PCT                                     Discontinue / Do not initiate                                             antibiotics Performed at [facility], [place] [address] [place], [place] [address]   Comprehensive metabolic panel     Status: Abnormal   Collection Time: 07/01/18  2:01 AM  Result Value Ref Range   Sodium 137 135 - 145 mmol/L   Potassium 4.1 3.5 - 5.1 mmol/L   Chloride 110 98 - 111 mmol/L   CO2 16 (L) 22 - 32 mmol/L   Glucose, Bld 151  (H) 70 - 99 mg/dL   BUN 18 6 - 20 mg/dL   Creatinine, Ser 0.99 0.44 - 1.00 mg/dL   Calcium 6.9 (L) 8.9 - 10.3 mg/dL   Total Protein 4.9 (L) 6.5 - 8.1 g/dL   Albumin 2.5 (L) 3.5 - 5.0 g/dL   AST 144 (H) 15 - 41 U/L   ALT 66 (H) 0 - 44 U/L   Alkaline Phosphatase 40 38 - 126 U/L   Total Bilirubin 1.3 (H) 0.3 - 1.2 mg/dL   GFR calc non Af Amer >60 >60 mL/min   GFR calc Af Amer >60 >60 mL/min    Comment: (NOTE) The eGFR has been calculated using the CKD EPI equation. This calculation has not been validated in all clinical situations. eGFR's persistently <60 mL/min signify possible Chronic Kidney Disease.    Anion gap 11 5 - 15    Comment: Performed at [facility] [address] [place], [place] [address]  CBC     Status: Abnormal   Collection Time: 07/01/18  2:01 AM  Result Value Ref Range   WBC 7.2 4.0 - 10.5 K/uL   RBC 3.86 (L) 3.87 - 5.11 MIL/uL   Hemoglobin 12.9 12.0 - 15.0 g/dL    Comment: REPEATED TO VERIFY DELTA CHECK NOTED    HCT 39.0 36.0 - 46.0 %   MCV 101.0 (H) 78.0 - 100.0 fL   MCH 33.4 26.0 - 34.0 pg   MCHC 33.1 30.0 - 36.0 g/dL   RDW 12.2 11.5 - 15.5 %   Platelets 175 150 - 400 K/uL    Comment: Performed at [facility], [place] [address] [place], [place] [address]  Magnesium     Status: None   Collection Time: 07/01/18  2:01 AM  Result Value Ref Range   Magnesium 1.8 1.7 - 2.4 mg/dL    Comment: Performed at [facility], [place] [address], [place], [place] [address]  CK  Status: Abnormal   Collection Time: 07/01/18  2:01 AM  Result Value Ref Range   Total CK 11,528 (H) 38 - 234 U/L    Comment: RESULTS CONFIRMED BY MANUAL DILUTION Performed at [facility], [place] [address] [place], [place] [address]   Culture, Urine     Status: None   Collection Time: 07/01/18  2:01 AM  Result Value Ref Range   Specimen Description URINE, CATHETERIZED    Special Requests NONE    Culture      NO GROWTH Performed at [facility], [place] [address] [place], [place] [address]    Report Status 07/02/2018 FINAL   Comprehensive metabolic panel     Status: Abnormal   Collection Time: 07/01/18  2:01 AM  Result Value Ref Range   Sodium 138 135 - 145 mmol/L   Potassium 4.0 3.5 - 5.1 mmol/L   Chloride 111 98 - 111 mmol/L   CO2 16 (L) 22 - 32 mmol/L   Glucose, Bld 147 (H) 70 - 99 mg/dL   BUN 18 6 - 20 mg/dL   Creatinine, Ser 0.96 0.44 - 1.00 mg/dL   Calcium 6.8 (L) 8.9 - 10.3 mg/dL   Total Protein 4.6 (L) 6.5 - 8.1 g/dL   Albumin 2.5 (L) 3.5 - 5.0 g/dL   AST 142 (H) 15 - 41 U/L   ALT 66 (H) 0 - 44 U/L   Alkaline Phosphatase 41 38 - 126 U/L   Total Bilirubin 1.3 (H) 0.3 - 1.2 mg/dL   GFR calc non Af Amer >60 >60 mL/min   GFR calc Af Amer >60 >60 mL/min    Comment: (NOTE) The eGFR has been calculated using the CKD EPI equation. This calculation has not been validated in all clinical situations. eGFR's persistently <60 mL/min signify possible Chronic Kidney Disease.    Anion gap 11 5 - 15    Comment: Performed at [facility] [address], [place], [place] [address]  Culture, blood (routine x 2)     Status: None (Preliminary result)   Collection Time: 07/01/18  2:28 AM  Result Value Ref Range   Specimen Description BLOOD LEFT BRACHIAL ARTERY    Special Requests      BOTTLES DRAWN AEROBIC AND ANAEROBIC Blood Culture adequate volume   Culture      NO GROWTH 1 DAY Performed at [facility], [place] [address] [place], [place] [address]    Report Status PENDING   MRSA PCR Screening     Status: None   Collection Time: 07/01/18  2:28 AM  Result Value Ref Range   MRSA by PCR NEGATIVE NEGATIVE    Comment:        The GeneXpert MRSA Assay (FDA approved for NASAL specimens only), is one component of a comprehensive MRSA colonization surveillance program. It is not intended to diagnose MRSA infection nor to guide or monitor treatment for MRSA infections. Performed at [facility], [place] [address],  [place],  [address]   I-STAT 3, arterial blood gas (G3+)     Status: Abnormal   Collection Time: 07/01/18  2:50 AM  Result Value Ref Range   pH, Arterial 7.269 (L) 7.350 - 7.450   pCO2 arterial 37.0 32.0 - 48.0 mmHg   pO2, Arterial 136.0 (H) 83.0 - 108.0 mmHg   Bicarbonate 16.8 (L) 20.0 - 28.0 mmol/L   TCO2 18 (L) 22 - 32 mmol/L   O2 Saturation 99.0 %  Acid-base deficit 9.0 (H) 0.0 - 2.0 mmol/L   Patient temperature 37.6 C    Sample type ARTERIAL   Glucose, capillary     Status: Abnormal   Collection Time: 07/01/18  4:18 AM  Result Value Ref Range   Glucose-Capillary 133 (H) 70 - 99 mg/dL  Glucose, capillary     Status: Abnormal   Collection Time: 07/01/18  7:13 AM  Result Value Ref Range   Glucose-Capillary 135 (H) 70 - 99 mg/dL  Glucose, capillary     Status: Abnormal   Collection Time: 07/01/18 11:00 AM  Result Value Ref Range   Glucose-Capillary 116 (H) 70 - 99 mg/dL  Glucose, capillary     Status: None   Collection Time: 07/01/18  3:02 PM  Result Value Ref Range   Glucose-Capillary 87 70 - 99 mg/dL  Protime-INR     Status: None   Collection Time: 07/01/18  4:22 PM  Result Value Ref Range   Prothrombin Time 14.3 11.4 - 15.2 seconds   INR 1.12     Comment: Performed at [facility], [place] [address], [place], [place] [address]  Procalcitonin     Status: None   Collection Time: 07/02/18  5:42 AM  Result Value Ref Range   Procalcitonin 8.25 ng/mL    Comment:        Interpretation: PCT > 2 ng/mL: Systemic infection (sepsis) is likely, unless other causes are known. (NOTE)       Sepsis PCT Algorithm           Lower Respiratory Tract                                      Infection PCT Algorithm    ----------------------------     ----------------------------         PCT < 0.25 ng/mL                PCT < 0.10 ng/mL         Strongly encourage             Strongly discourage   discontinuation of antibiotics    initiation of antibiotics     ----------------------------     -----------------------------       PCT 0.25 - 0.50 ng/mL            PCT 0.10 - 0.25 ng/mL               OR       >80% decrease in PCT            Discourage initiation of                                            antibiotics      Encourage discontinuation           of antibiotics    ----------------------------     -----------------------------         PCT >= 0.50 ng/mL              PCT 0.26 - 0.50 ng/mL               AND       <80% decrease in PCT  Encourage initiation of                                             antibiotics       Encourage continuation           of antibiotics    ----------------------------     -----------------------------        PCT >= 0.50 ng/mL                  PCT > 0.50 ng/mL               AND         increase in PCT                  Strongly encourage                                      initiation of antibiotics    Strongly encourage escalation           of antibiotics                                     -----------------------------                                           PCT <= 0.25 ng/mL                                                 OR                                        > 80% decrease in PCT                                     Discontinue / Do not initiate                                             antibiotics Performed at [facility], [address] [place], [place] [address]   Basic metabolic panel     Status: Abnormal   Collection Time: 07/02/18  5:42 AM  Result Value Ref Range   Sodium 140 135 - 145 mmol/L   Potassium 3.5 3.5 - 5.1 mmol/L   Chloride 109 98 - 111 mmol/L   CO2 23 22 - 32 mmol/L   Glucose, Bld 110 (H) 70 - 99 mg/dL   BUN 6 6 - 20 mg/dL   Creatinine, Ser 0.66 0.44 - 1.00 mg/dL   Calcium 8.5 (L) 8.9 - 10.3 mg/dL   GFR calc non Af Amer >60 >60 mL/min   GFR calc Af Amer >60 >60 mL/min  Comment: (NOTE) The eGFR has been calculated using the CKD EPI  equation. This calculation has not been validated in all clinical situations. eGFR's persistently <60 mL/min signify possible Chronic Kidney Disease.    Anion gap 8 5 - 15    Comment: Performed at [facility] [address] [place], [place] [address]  CBC     Status: Abnormal   Collection Time: 07/02/18  5:42 AM  Result Value Ref Range   WBC 8.8 4.0 - 10.5 K/uL   RBC 3.23 (L) 3.87 - 5.11 MIL/uL   Hemoglobin 10.7 (L) 12.0 - 15.0 g/dL   HCT 32.6 (L) 36.0 - 46.0 %   MCV 100.9 (H) 78.0 - 100.0 fL   MCH 33.1 26.0 - 34.0 pg   MCHC 32.8 30.0 - 36.0 g/dL   RDW 12.0 11.5 - 15.5 %   Platelets 155 150 - 400 K/uL    Comment: Performed at [facility] [address], [place], [place] [address]  Hepatic function panel     Status: Abnormal   Collection Time: 07/02/18  5:42 AM  Result Value Ref Range   Total Protein 4.9 (L) 6.5 - 8.1 g/dL   Albumin 2.3 (L) 3.5 - 5.0 g/dL   AST 176 (H) 15 - 41 U/L   ALT 76 (H) 0 - 44 U/L   Alkaline Phosphatase 47 38 - 126 U/L   Total Bilirubin 0.5 0.3 - 1.2 mg/dL   Bilirubin, Direct 0.2 0.0 - 0.2 mg/dL   Indirect Bilirubin 0.3 0.3 - 0.9 mg/dL    Comment: Performed at [facility] [address] [place], [place] [address]  CK     Status: Abnormal   Collection Time: 07/02/18  5:42 AM  Result Value Ref Range   Total CK 8,407 (H) 38 - 234 U/L    Comment: RESULTS CONFIRMED BY MANUAL DILUTION Performed at [facility], [place] [address], [place],  [address]   Glucose, capillary     Status: Abnormal   Collection Time: 07/02/18  6:56 AM  Result Value Ref Range   Glucose-Capillary 123 (H) 70 - 99 mg/dL  Glucose, capillary     Status: Abnormal   Collection Time: 07/02/18 12:00 PM  Result Value Ref Range   Glucose-Capillary 103 (H) 70 - 99 mg/dL    Current Facility-Administered Medications  Medication Dose Route Frequency Provider Last Rate Last Dose  . 0.9 %  sodium chloride infusion   Intravenous Continuous Rai, Ripudeep K,  MD 125 mL/hr at 07/02/18 0947    . acetaminophen (TYLENOL) tablet 650 mg  650 mg Oral Q4H PRN Rai, Ripudeep K, MD   650 mg at 07/02/18 0940  . heparin injection 5,000 Units  5,000 Units Subcutaneous Q8H Omar Person, NP   5,000 Units at 07/02/18 1412  . lidocaine (LIDODERM) 5 % 1 patch  1 patch Transdermal Q24H Rai, Ripudeep K, MD   1 patch at 07/02/18 0950  . oxyCODONE (Oxy IR/ROXICODONE) immediate release tablet 5 mg  5 mg Oral Q4H PRN Scatliffe, Rise Paganini, MD   5 mg at 07/02/18 0147  . piperacillin-tazobactam (ZOSYN) IVPB 3.375 g  3.375 g Intravenous Q8H Kris Mouton, RPH 12.5 mL/hr at 07/02/18 0950 3.375 g at 07/02/18 0950  . vancomycin (VANCOCIN) IVPB 750 mg/150 ml premix  750 mg Intravenous Q12H Scatliffe, Kristen D, MD 150 mL/hr at 07/02/18 1411 750 mg at 07/02/18 1411    Musculoskeletal: Strength & Muscle Tone: within  normal limits Gait & Station: UTA since patient is lying in bed. Patient leans: N/A  Psychiatric Specialty Exam: Physical Exam  Nursing note and vitals reviewed. Constitutional: She is oriented to person, place, and time. She appears well-developed and well-nourished.  HENT:  Head: Normocephalic and atraumatic.  Neck: Normal range of motion.  Respiratory: Effort normal.  Musculoskeletal: Normal range of motion.  Neurological: She is alert and oriented to person, place, and time.  Psychiatric: She has a normal mood and affect. Her speech is normal and behavior is normal. Judgment and thought content normal. Cognition and memory are normal.    Review of Systems  Constitutional: Negative for chills and fever.  Cardiovascular: Positive for chest pain.  Gastrointestinal: Positive for diarrhea and nausea. Negative for abdominal pain, constipation and vomiting.  Psychiatric/Behavioral: Negative for depression, hallucinations, substance abuse and suicidal ideas. The patient is not nervous/anxious and does not have insomnia.   All other systems reviewed and are  negative.   Blood pressure 96/71, pulse 90, temperature 99.1 F (37.3 C), temperature source Oral, resp. rate 18, height 5' 2"  (1.575 m), weight 66.1 kg, SpO2 95 %.Body mass index is 26.65 kg/m.  General Appearance: Fairly Groomed, middle aged, Caucasian female, wearing a hospital gown and lying in bed. NAD.   Eye Contact:  Good  Speech:  Clear and Coherent and Normal Rate  Volume:  Normal  Mood:  Euthymic  Affect:  Constricted  Thought Process:  Goal Directed, Linear and Descriptions of Associations: Intact  Orientation:  Full (Time, Place, and Person)  Thought Content:  Logical  Suicidal Thoughts:  No  Homicidal Thoughts:  No  Memory:  Immediate;   Good Recent;   Good Remote;   Good  Judgement:  Fair  Insight:  Fair  Psychomotor Activity:  Normal  Concentration:  Concentration: Good and Attention Span: Good  Recall:  Good  Fund of Knowledge:  Good  Language:  Good  Akathisia:  No  Handed:  Right  AIMS (if indicated):   N/A  Assets:  Agricultural consultant Housing Intimacy Social Support  ADL's:  Intact  Cognition:  WNL  Sleep:   Okay   Assessment:  PASSION LAVIN is a 52 y.o. female who was admitted with Klonopin and Flexeril overdose. Patient adamantly denies SI or overdose although she does not appear forthcoming with information or the amount of pills she ingested given known refill per PMP. Also family report that she has been severely depressed and concern for self harm. She appears to be minimizing her symptoms and has a constricted affect throughout interview. She warrants inpatient psychiatric hospitalization for stabilization and treatment.   Treatment Plan Summary: -Patient warrants inpatient psychiatric hospitalization given high risk of harm to self. -Continue bedside sitter.  -Continue to hold home medications given overdose until patient is medically stable. -EKG reviewed and QTc 477 on 9/29. Please closely monitor when  starting or increasing QTc prolonging agents.  -Please pursue involuntary commitment if patient refuses voluntary psychiatric hospitalization or attempts to leave the hospital.  -Will sign off on patient at this time. Please [facility] psychiatry again as needed.     Disposition: Recommend psychiatric Inpatient admission when medically cleared.  Faythe Dingwall, DO 07/02/2018 2:19 PM

## 2018-07-02 NOTE — Evaluation (Signed)
Physical Therapy Evaluation Patient Details Name: Kim Massey MRN: [id number] DOB: 08-16-1966 Today's Date: 07/02/2018   History of Present Illness  Pt is a 52 y.o. female admitted 06/30/18 after being found unresponsive with suspected overdose. ETT 9/29-9/30. Suspect CAP secondary to aspiration. AKI with metabolic acidosis, rhabdomyolysis. Awaiting psych consult. PMH includes bipolar disorder, HTN.     Clinical Impression  Pt presents with an overall decrease in functional mobility secondary to above. PTA, pt indep and lives with fiance who works during day. Today, pt required UE support and minA to prevent LOB with ambulation. Pt c/o chronic back pain affecting BLEs; RLE strength grossly 3/5 throughout. Also demonstrating slowed processing and decreased safety awareness. Encouraged continued ambulation with use of RW and assist from nursing staff (NT notified). Will follow acutely to address established goals.    Follow Up Recommendations Supervision/Assistance - 24 hour(IP Psych? or HHPT pending pt progression)    Equipment Recommendations  (TBD)    Recommendations for Other Services       Precautions / Restrictions Precautions Precautions: Fall Restrictions Weight Bearing Restrictions: No      Mobility  Bed Mobility Overal bed mobility: Modified Independent             General bed mobility comments: HOB slightly elevated; increased time and effort  Transfers Overall transfer level: Needs assistance Equipment used: 1 person hand held assist Transfers: Sit to/from Stand Sit to Stand: Min assist         General transfer comment: Requiring HHA and minA to maintain balance; poor balance strategies with uncontrolled sit on bed  Ambulation/Gait Ambulation/Gait assistance: Min assist Gait Distance (Feet): 60 Feet Assistive device: 1 person hand held assist Gait Pattern/deviations: Step-to pattern;Decreased weight shift to right;Staggering right;Staggering  left Gait velocity: Decreased Gait velocity interpretation: <1.8 ft/sec, indicate of risk for recurrent falls General Gait Details: Slow, antalgic, unsteady amb with UE support on hand rail; consistent minA to maintain balance. Pt reports unstable due to RLE pain/weaknessRecommend use of RW for further stability  Stairs            Wheelchair Mobility    Modified Rankin (Stroke Patients Only)       Balance Overall balance assessment: Needs assistance   Sitting balance-Leahy Scale: Fair Sitting balance - Comments: can fix socks sitting EOB     Standing balance-Leahy Scale: Poor Standing balance comment: Reliant on intermittent UE support and external assist                             Pertinent Vitals/Pain Pain Assessment: Faces Faces Pain Scale: Hurts little more Pain Location: Lower back, BLE (R>L) Pain Descriptors / Indicators: Constant;Guarding;Shooting Pain Intervention(s): Monitored during session;Premedicated before session    Home Living Family/patient expects to be discharged to:: Private residence Living Arrangements: Spouse/significant other(Fiance) Available Help at Discharge: Family;Available PRN/intermittently(Fiance) Type of Home: Apartment Home Access: Level entry     Home Layout: One level Home Equipment: None      Prior Function Level of Independence: Independent         Comments: 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)      Higher education careers adviser        Extremity/Trunk Assessment   Upper Extremity Assessment Upper Extremity Assessment: Overall WFL for tasks assessed    Lower Extremity Assessment Lower Extremity Assessment: RLE deficits/detail RLE Deficits / Details: Hip flex 3/5, knee flex 3/5, knee ext 3+5  RLE: Unable to fully assess due to pain       Communication   Communication: No difficulties  Cognition Arousal/Alertness: Awake/alert Behavior During Therapy: WFL for tasks assessed/performed Overall  Cognitive Status: No family/caregiver present to determine baseline cognitive functioning Area of Impairment: Orientation;Attention;Following commands;Safety/judgement;Awareness;Problem solving                   Current Attention Level: Selective   Following Commands: Follows multi-step commands inconsistently Safety/Judgement: Decreased awareness of safety;Decreased awareness of deficits Awareness: Emergent Problem Solving: Slow processing;Requires verbal cues General Comments: When asked date, pt looking to calendar and saying "I cheat... it's September 26th" when asked again, saying "October 1st" correctly      General Comments General comments (skin integrity, edema, etc.): Fiance present and asleep on cough    Exercises     Assessment/Plan    PT Assessment Patient needs continued PT services  PT Problem List Decreased strength;Decreased activity tolerance;Decreased balance;Decreased mobility;Decreased cognition;Decreased knowledge of use of DME;Decreased safety awareness       PT Treatment Interventions DME instruction;Gait training;Stair training;Functional mobility training;Therapeutic activities;Therapeutic exercise;Balance training;Cognitive remediation;Patient/family education    PT Goals (Current goals can be found in the Care Plan section)  Acute Rehab PT Goals Patient Stated Goal: Return home and get procedure done from back pain  PT Goal Formulation: With patient Time For Goal Achievement: 07/16/18 Potential to Achieve Goals: Fair    Frequency Min 3X/week   Barriers to discharge        Co-evaluation               AM-PAC PT "6 Clicks" Daily Activity  Outcome Measure Difficulty turning over in bed (including adjusting bedclothes, sheets and blankets)?: A Little Difficulty moving from lying on back to sitting on the side of the bed? : A Little Difficulty sitting down on and standing up from a chair with arms (e.g., wheelchair, bedside commode,  etc,.)?: Unable Help needed moving to and from a bed to chair (including a wheelchair)?: A Little Help needed walking in hospital room?: A Little Help needed climbing 3-5 steps with a railing? : A Lot 6 Click Score: 15    End of Session Equipment Utilized During Treatment: Gait belt Activity Tolerance: Patient limited by fatigue Patient left: in bed;with call bell/phone within reach;with bed alarm set;with nursing/sitter in room;with family/visitor present Nurse Communication: Mobility status PT Visit Diagnosis: Other abnormalities of gait and mobility (R26.89);Muscle weakness (generalized) (M62.81)    Time: 5638-7564 PT Time Calculation (min) (ACUTE ONLY): 15 min   Charges:   PT Evaluation $PT Eval Moderate Complexity: 1 Mod          Ina Homes, PT, DPT Acute Rehabilitation Services  Pager [phone] Office [phone]  Malachy Chamber 07/02/2018, 2:53 PM

## 2018-07-02 NOTE — Clinical Social Work Note (Deleted)
CSW visited with patient to provide facility responses and was given permission to call her daughter, Kim Massey. Call made to daughter and facility responses given, and  [facility]. [name], facility liaison contacted and informed and per Tammy they will initiate insurance authorization with [employer]. CSW will continue to follow, provide SW intervention services as needed and facilitate discharge to a skilled facility once authorization received.  Kim Massey, MSW, LCSW Licensed Clinical Social Worker Clinical Social Work Department [facility] [phone]          

## 2018-07-02 NOTE — Progress Notes (Signed)
[facility]                                                                              Patient Demographics  Kim Massey, is a 52 y.o. female, DOB - [birth year], XTK:[id number]  Admit date - 06/30/2018   Admitting Physician Kandice Hams, MD  Outpatient Primary MD for the patient is Marcia Brash, MD (Inactive)  Outpatient specialists:   LOS - 2  days   Medical records reviewed and are as summarized below:    Chief Complaint  Patient presents with  . Ingestion       Brief summary   Patient is a 52 year old female with bipolar disorder, hypertension, C VID presented to ED when found unresponsive by roommate with empty pill bottles of clonazepam and Flexeril at the bedside.  He was unresponsive for EMS on arrival.  Patient was intubated for airway protection, admitted to PCCM to ICU.  Urine drug screen also showed benzos, opiates, amphetamines She was extubated on 9/30, transferred to try to hospitalist, assumed care on 10/1.    Assessment & Plan    Principal Problem:   Acute metabolic encephalopathy with underlying history of bipolar disorder and suspected overdose -Patient was found unresponsive with empty pil bottles, was intubated for airway protection, extubated on 9/30 -Urine drug screen showed benzodiazepines, opiates, amphetamines -Continue to hold baclofen, clonazepam, cyclobenzaprine, gabapentin, psychiatry consulted -Currently alert and oriented, still at baseline, confirmed by family member at the bedside   Active Problems: Community-acquired pneumonia versus aspiration with underlying history of CVID -Aspiration more likely given overdose and initial unresponsiveness at the time of admission -Chest x-ray showed right lung consolidation -Continue IV vancomycin and Zosyn, given immunocompromised -Follow cultures, procalcitonin trending down  Acute kidney injury with metabolic acidosis secondary to hypovolemia,  rhabdomyolysis -Creatinine 1.6 at the time of admission with lactic acid 2.5, CK 11,528 -Continue IV fluid hydration, creatinine improved to 0.6, CK trending down to 8.4, lactic acid improved to 1.5  Fall at home with rhabdomyolysis -PT OT evaluation  Transaminitis -Possibly due to rhabdomyolysis, hypotension and overdose, follow closely, trending up -Check hepatitis panel, right upper quadrant ultrasound    Acute respiratory failure with hypercapnia ([place]) -Secondary to overdose, patient is now successfully extubated on 9/30 -O2 sats 95% on room air  Code Status: Full code DVT Prophylaxis: Heparin subcu Family Communication: Discussed in detail with the patient, all imaging results, lab results explained to the patient and family member at the bedside    Disposition Plan: Remains inpatient, needing psychiatry evaluation, further work-up of transaminitis, hopefully DC next 24 to 48 hours to inpatient psych versus home if cleared  Time Spent in minutes   35 minutes  Procedures:  Mechanical intubation  Consultants:   PCCM Psychiatry  Antimicrobials:   IV vancomycin  IV Zosyn   Medications  Scheduled Meds: . chlorhexidine gluconate (MEDLINE KIT)  15 mL Mouth Rinse BID  . heparin  5,000 Units Subcutaneous Q8H  . lidocaine  1 patch Transdermal Q24H   Continuous Infusions: . sodium chloride 125 mL/hr at 07/02/18 0947  . piperacillin-tazobactam (ZOSYN)  IV 3.375 g (07/02/18 0950)  .  vancomycin 750 mg (07/02/18 0245)   PRN Meds:.acetaminophen, oxyCODONE   Antibiotics   Anti-infectives (From admission, onward)   Start     Dose/Rate Route Frequency Ordered Stop   07/01/18 1400  vancomycin (VANCOCIN) IVPB 750 mg/150 ml premix     750 mg 150 mL/hr over 60 Minutes Intravenous Every 12 hours 07/01/18 0851     07/01/18 0200  piperacillin-tazobactam (ZOSYN) IVPB 3.375 g     3.375 g 12.5 mL/hr over 240 Minutes Intravenous Every 8 hours 06/30/18 2138     07/01/18 0045   vancomycin (VANCOCIN) IVPB 1000 mg/200 mL premix  Status:  Discontinued     1,000 mg 200 mL/hr over 60 Minutes Intravenous Every 24 hours 07/01/18 0037 07/01/18 0851   06/30/18 1945  piperacillin-tazobactam (ZOSYN) IVPB 3.375 g     3.375 g 100 mL/hr over 30 Minutes Intravenous  Once 06/30/18 1938 06/30/18 2045        Subjective:   Kim Massey was seen and examined today.  States pain on the right knee and thigh, had fallen on the right side prior to admission.  States does not remember how she fell. patient denies dizziness, chest pain, shortness of breath, abdominal pain, N/V/D/C, new weakness, numbess, tingling. No acute events overnight.    Objective:   Vitals:   07/01/18 2113 07/02/18 0450 07/02/18 0531 07/02/18 0806  BP: 105/73 (!) 89/55 91/63 96/71   Pulse: 98 99 95 90  Resp: 20 18 18 18   Temp: 99.2 F (37.3 C) 99.3 F (37.4 C)  99.1 F (37.3 C)  TempSrc: Oral Oral  Oral  SpO2: 100% 95% 98% 95%  Weight:      Height:        Intake/Output Summary (Last 24 hours) at 07/02/2018 1112 Last data filed at 07/02/2018 0915 Gross per 24 hour  Intake 773.75 ml  Output 1425 ml  Net -651.25 ml     Wt Readings from Last 3 Encounters:  07/01/18 66.1 kg  11/04/10 74.4 kg     Exam  General: Alert and oriented x 3, NAD  Eyes:   HEENT:  Atraumatic, normocephalic, normal oropharynx  Cardiovascular: S1 S2 auscultated,  Regular rate and rhythm.  Respiratory: Clear to auscultation bilaterally, no wheezing, rales or rhonchi  Gastrointestinal: Soft, nontender, nondistended, + bowel sounds  Ext: no pedal edema bilaterally  Neuro: no new deficit  Musculoskeletal: No digital cyanosis, clubbing  Skin: Abrasions on the knees  Psych: Normal affect and demeanor, alert and oriented x3    Data Reviewed:  I have personally reviewed following labs and imaging studies  Micro Results Recent Results (from the past 240 hour(s))  Culture, blood (routine x 2)     Status: None  (Preliminary result)   Collection Time: 06/30/18 10:04 PM  Result Value Ref Range Status   Specimen Description BLOOD LEFT ANTECUBITAL  Final   Special Requests   Final    BOTTLES DRAWN AEROBIC AND ANAEROBIC Blood Culture adequate volume   Culture   Final    NO GROWTH 2 DAYS Performed at [facility], [address] [place], [place] [address]    Report Status PENDING  Incomplete  Culture, Urine     Status: None   Collection Time: 07/01/18  2:01 AM  Result Value Ref Range Status   Specimen Description URINE, CATHETERIZED  Final   Special Requests NONE  Final   Culture   Final    NO GROWTH Performed at [facility], [address] [place], [place] [address]    Report Status 07/02/2018 FINAL  Final  Culture, blood (routine x 2)     Status: None (Preliminary result)   Collection Time: 07/01/18  2:28 AM  Result Value Ref Range Status   Specimen Description BLOOD LEFT BRACHIAL ARTERY  Final   Special Requests   Final    BOTTLES DRAWN AEROBIC AND ANAEROBIC Blood Culture adequate volume   Culture   Final    NO GROWTH 1 DAY Performed at [facility], [place] [address] [place], [place] [address]    Report Status PENDING  Incomplete  MRSA PCR Screening     Status: None   Collection Time: 07/01/18  2:28 AM  Result Value Ref Range Status   MRSA by PCR NEGATIVE NEGATIVE Final    Comment:        The GeneXpert MRSA Assay (FDA approved for NASAL specimens only), is one component of a comprehensive MRSA colonization surveillance program. It is not intended to diagnose MRSA infection nor to guide or monitor treatment for MRSA infections. Performed at [facility], [place] [address], [place], [place] [address]     Radiology Reports Dg Knee 1-2 Views Right  Result Date: 07/01/2018 CLINICAL DATA:  Right knee pain EXAM: RIGHT KNEE - 1-2 VIEW COMPARISON:  None. FINDINGS: No fracture or malalignment small knee effusion. Edema within the suprapatellar soft tissues.  Joint spaces are maintained IMPRESSION: 1. No acute osseous abnormality 2. Small knee effusion 3. Edema within the suprapatellar soft tissues Electronically Signed   By: Donavan Foil M.D.   On: 07/01/2018 16:31   Ct Head Wo Contrast  Result Date: 06/30/2018 CLINICAL DATA:  Altered LOC EXAM: CT HEAD WITHOUT CONTRAST TECHNIQUE: Contiguous axial images were obtained from the base of the skull through the vertex without intravenous contrast. COMPARISON:  02/18/2008 FINDINGS: Brain: No evidence of acute infarction, hemorrhage, hydrocephalus, extra-axial collection or mass lesion/mass effect. Vascular: No hyperdense vessel or unexpected calcification. Skull: Normal. Negative for fracture or focal lesion. Sinuses/Orbits: Postsurgical changes of the maxillary and ethmoid sinuses. Mild mucosal thickening in the maxillary and ethmoid sinuses. Other: None IMPRESSION: 1. Negative non contrasted CT appearance of the brain 2. Paranasal sinus disease Electronically Signed   By: Donavan Foil M.D.   On: 06/30/2018 22:19   Dg Chest Port 1 View  Result Date: 07/02/2018 CLINICAL DATA:  Community acquired pneumonia EXAM: PORTABLE CHEST 1 VIEW COMPARISON:  06/30/2018 FINDINGS: Interval extubation and removal of NG tube. Right Port-A-Cath remains in place, unchanged. Right lung airspace disease again noted, most confluent in the right upper lobe but also noted at the right lung base, slightly increased at the right base. Left lung clear. Heart is borderline in size. IMPRESSION: Diffuse right lung airspace disease, increased at the right base. Findings likely reflect pneumonia. Borderline heart size. Electronically Signed   By: Rolm Baptise M.D.   On: 07/02/2018 08:12   Dg Chest Portable 1 View  Result Date: 06/30/2018 CLINICAL DATA:  Altered mental status EXAM: PORTABLE CHEST 1 VIEW COMPARISON:  Report 09/05/2014 FINDINGS: Endotracheal tube tip is about 19 mm superior to the carina. Esophageal tube tip overlies the proximal  stomach. Right-sided central venous catheter tip overlies the proximal right atrium. Streaky atelectasis at the left base. Dense consolidation in the right upper lobe with air bronchograms. Patchy infiltrate at the peripheral right lung base. Mildly dilated bowel in the upper abdomen. IMPRESSION: 1. Endotracheal tube tip about 19 mm superior to  the carina. Esophageal tube tip overlies the proximal stomach 2. Dense right upper lobe consolidation concerning for a pneumonia. Additional mild infiltrate at the right lateral lung base. Electronically Signed   By: Donavan Foil M.D.   On: 06/30/2018 19:18    Lab Data:  CBC: Recent Labs  Lab 06/30/18 1752 07/01/18 0201 07/02/18 0542  WBC 6.1 7.2 8.8  NEUTROABS 4.5  --   --   HGB 16.5* 12.9 10.7*  HCT 50.2* 39.0 32.6*  MCV 100.2* 101.0* 100.9*  PLT 249 175 686   Basic Metabolic Panel: Recent Labs  Lab 06/30/18 1752 07/01/18 0201 07/02/18 0542  NA 137 138  137 140  K 4.4 4.0  4.1 3.5  CL 103 111  110 109  CO2 15* 16*  16* 23  GLUCOSE 96 147*  151* 110*  BUN 23* 18  18 6   CREATININE 1.61* 0.96  0.99 0.66  CALCIUM 8.6* 6.8*  6.9* 8.5*  MG 2.0 1.8  --    GFR: Estimated Creatinine Clearance: 74.2 mL/min (by C-G formula based on SCr of 0.66 mg/dL). Liver Function Tests: Recent Labs  Lab 06/30/18 1752 07/01/18 0201 07/02/18 0542  AST 185* 142*  144* 176*  ALT 84* 66*  66* 76*  ALKPHOS 61 41  40 47  BILITOT 1.3* 1.3*  1.3* 0.5  PROT 6.5 4.6*  4.9* 4.9*  ALBUMIN 3.7 2.5*  2.5* 2.3*   No results for input(s): LIPASE, AMYLASE in the last 168 hours. No results for input(s): AMMONIA in the last 168 hours. Coagulation Profile: Recent Labs  Lab 07/01/18 1622  INR 1.12   Cardiac Enzymes: Recent Labs  Lab 07/01/18 0201 07/02/18 0542  CKTOTAL 11,528* 8,407*   BNP (last 3 results) No results for input(s): PROBNP in the last 8760 hours. HbA1C: No results for input(s): HGBA1C in the last 72 hours. CBG: Recent Labs   Lab 07/01/18 0418 07/01/18 0713 07/01/18 1100 07/01/18 1502 07/02/18 0656  GLUCAP 133* 135* 116* 87 123*   Lipid Profile: No results for input(s): CHOL, HDL, LDLCALC, TRIG, CHOLHDL, LDLDIRECT in the last 72 hours. Thyroid Function Tests: No results for input(s): TSH, T4TOTAL, FREET4, T3FREE, THYROIDAB in the last 72 hours. Anemia Panel: No results for input(s): VITAMINB12, FOLATE, FERRITIN, TIBC, IRON, RETICCTPCT in the last 72 hours. Urine analysis:    Component Value Date/Time   COLORURINE YELLOW 06/30/2018 1908   APPEARANCEUR HAZY (A) 06/30/2018 1908   LABSPEC 1.019 06/30/2018 1908   PHURINE 5.0 06/30/2018 1908   GLUCOSEU NEGATIVE 06/30/2018 1908   HGBUR LARGE (A) 06/30/2018 1908   BILIRUBINUR NEGATIVE 06/30/2018 1908   KETONESUR 80 (A) 06/30/2018 1908   PROTEINUR 30 (A) 06/30/2018 1908   NITRITE NEGATIVE 06/30/2018 1908   LEUKOCYTESUR NEGATIVE 06/30/2018 1908     Tarick Parenteau M.D. [facility] 07/02/2018, 11:12 AM  Pager: [phone] Between 7am to 7pm - call Pager - [phone]  After 7pm go to [email or web address] - password [email or web address]  Call night coverage person covering after 7pm

## 2018-07-03 ENCOUNTER — Encounter (HOSPITAL_COMMUNITY): Payer: Self-pay | Admitting: General Practice

## 2018-07-03 DIAGNOSIS — T796XXA Traumatic ischemia of muscle, initial encounter: Secondary | ICD-10-CM

## 2018-07-03 DIAGNOSIS — T50904A Poisoning by unspecified drugs, medicaments and biological substances, undetermined, initial encounter: Secondary | ICD-10-CM

## 2018-07-03 LAB — COMPREHENSIVE METABOLIC PANEL
ALT: 84 U/L — AB (ref 0–44)
AST: 165 U/L — AB (ref 15–41)
Albumin: 2.2 g/dL — ABNORMAL LOW (ref 3.5–5.0)
Alkaline Phosphatase: 46 U/L (ref 38–126)
Anion gap: 2 — ABNORMAL LOW (ref 5–15)
BILIRUBIN TOTAL: 0.3 mg/dL (ref 0.3–1.2)
BUN: 5 mg/dL — AB (ref 6–20)
CO2: 24 mmol/L (ref 22–32)
CREATININE: 0.7 mg/dL (ref 0.44–1.00)
Calcium: 7.7 mg/dL — ABNORMAL LOW (ref 8.9–10.3)
Chloride: 112 mmol/L — ABNORMAL HIGH (ref 98–111)
Glucose, Bld: 94 mg/dL (ref 70–99)
POTASSIUM: 3.5 mmol/L (ref 3.5–5.1)
Sodium: 138 mmol/L (ref 135–145)
TOTAL PROTEIN: 5.2 g/dL — AB (ref 6.5–8.1)

## 2018-07-03 LAB — GLUCOSE, CAPILLARY
GLUCOSE-CAPILLARY: 112 mg/dL — AB (ref 70–99)
GLUCOSE-CAPILLARY: 139 mg/dL — AB (ref 70–99)
GLUCOSE-CAPILLARY: 97 mg/dL (ref 70–99)
Glucose-Capillary: 91 mg/dL (ref 70–99)
Glucose-Capillary: 90 mg/dL (ref 70–99)

## 2018-07-03 LAB — HEPATITIS PANEL, ACUTE
HCV Ab: <0.1 s/co ratio (ref 0.0–0.9)
HEP A IGM: NEGATIVE
HEP B C IGM: NEGATIVE
Hepatitis B Surface Ag: NEGATIVE

## 2018-07-03 LAB — PROCALCITONIN: Procalcitonin: 4.72 ng/mL

## 2018-07-03 LAB — CK: Total CK: 6202 U/L — ABNORMAL HIGH (ref 38–234)

## 2018-07-03 LAB — CBC
HEMATOCRIT: 30.6 % — AB (ref 36.0–46.0)
Hemoglobin: 10.1 g/dL — ABNORMAL LOW (ref 12.0–15.0)
MCH: 33.6 pg (ref 26.0–34.0)
MCHC: 33 g/dL (ref 30.0–36.0)
MCV: 101.7 fL — AB (ref 78.0–100.0)
Platelets: 152 10*3/uL (ref 150–400)
RBC: 3.01 MIL/uL — ABNORMAL LOW (ref 3.87–5.11)
RDW: 11.9 % (ref 11.5–15.5)
WBC: 8.1 10*3/uL (ref 4.0–10.5)

## 2018-07-03 MED ORDER — AMOXICILLIN-POT CLAVULANATE 875-125 MG PO TABS
1.0000 | ORAL_TABLET | Freq: Two times a day (BID) | ORAL | Status: DC
  Administered 2018-07-03 – 2018-07-04 (×2): 1 via ORAL
  Filled 2018-07-03 (×2): qty 1

## 2018-07-03 NOTE — Care Management Important Message (Signed)
Important Message  Patient Details  Name: Kim Massey MRN: [id number] Date of Birth: 05/01/1966   Medicare Important Message Given:  Yes    [name] 07/03/2018, 2:18 PM

## 2018-07-03 NOTE — Progress Notes (Signed)
Physical Therapy Treatment Patient Details Name: Kim Massey MRN: [id number] DOB: 16-Nov-1965 Today's Date: 07/03/2018    History of Present Illness Pt is a 52 y.o. female admitted 06/30/18 after being found unresponsive with suspected overdose. ETT 9/29-9/30. Suspect CAP secondary to aspiration. AKI with metabolic acidosis, rhabdomyolysis. Awaiting psych consult. PMH includes bipolar disorder, HTN.     PT Comments    Patient is progressing very well towards their physical therapy goals. Noted improved balance, pain control, and increased activity tolerance as evidenced by ambulating 1000 feet with no assistive device. Continues to require occasional minimal assistance for balance, particularly with distractions or high level balance activities. Will continue to follow acutely.     Follow Up Recommendations  Supervision/Assistance - 24 hour;Home health PT(noted potential d/c to IP Psych)     Equipment Recommendations  None recommended by PT    Recommendations for Other Services       Precautions / Restrictions Precautions Precautions: Fall Restrictions Weight Bearing Restrictions: No    Mobility  Bed Mobility Overal bed mobility: Modified Independent                Transfers Overall transfer level: Needs assistance   Transfers: Sit to/from Stand Sit to Stand: Supervision            Ambulation/Gait Ambulation/Gait assistance: Min assist;Min guard Gait Distance (Feet): 1000 Feet Assistive device: None Gait Pattern/deviations: Decreased weight shift to right;Step-through pattern Gait velocity: Decreased   General Gait Details: Patient with slightly improved balance, requiring occasional min assist for balance. Verbal cueing for decreased right step length. Noted decreased reciprocal arm swing. Performed head turns and stepping around obstacles with minA. Occasionally reaching for railing for additional support   Stairs             Wheelchair  Mobility    Modified Rankin (Stroke Patients Only)       Balance Overall balance assessment: Needs assistance   Sitting balance-Leahy Scale: Good       Standing balance-Leahy Scale: Fair                              Cognition Arousal/Alertness: Awake/alert Behavior During Therapy: WFL for tasks assessed/performed Overall Cognitive Status: Impaired/Different from baseline Area of Impairment: Attention;Following commands;Safety/judgement;Awareness                   Current Attention Level: Selective   Following Commands: Follows multi-step commands inconsistently Safety/Judgement: Decreased awareness of safety;Decreased awareness of deficits Awareness: Emergent          Exercises      General Comments        Pertinent Vitals/Pain Pain Assessment: Faces Faces Pain Scale: Hurts a little bit Pain Location: Lower back; denies pain in BLE today Pain Descriptors / Indicators: Guarding;Shooting Pain Intervention(s): Monitored during session    Home Living                      Prior Function            PT Goals (current goals can now be found in the care plan section) Acute Rehab PT Goals Patient Stated Goal: Return home and get procedure done from back pain  Potential to Achieve Goals: Fair Progress towards PT goals: Progressing toward goals    Frequency    Min 3X/week      PT Plan Current plan remains appropriate    Co-evaluation  AM-PAC PT "6 Clicks" Daily Activity  Outcome Measure  Difficulty turning over in bed (including adjusting bedclothes, sheets and blankets)?: None Difficulty moving from lying on back to sitting on the side of the bed? : None Difficulty sitting down on and standing up from a chair with arms (e.g., wheelchair, bedside commode, etc,.)?: A Little Help needed moving to and from a bed to chair (including a wheelchair)?: A Little Help needed walking in hospital room?: A Little Help  needed climbing 3-5 steps with a railing? : A Little 6 Click Score: 20    End of Session Equipment Utilized During Treatment: Gait belt Activity Tolerance: Patient tolerated treatment well Patient left: in chair;with call bell/phone within reach;with nursing/sitter in room   PT Visit Diagnosis: Other abnormalities of gait and mobility (R26.89);Muscle weakness (generalized) (M62.81)     Time: 1761-6073 PT Time Calculation (min) (ACUTE ONLY): 16 min  Charges:  $Gait Training: 8-22 mins                     Laurina Bustle, [place], DPT Acute Rehabilitation Services Pager [phone] Office [phone]    Vanetta Mulders 07/03/2018, 3:59 PM

## 2018-07-03 NOTE — Care Management Note (Signed)
Case Management Note  Patient Details  Name: Kim Massey MRN: [id number] Date of Birth: November 01, 1965  Subjective/Objective:                   Per Psychiatry note 07/02/18:  Assessment:  Kim Massey is a 52 y.o. female who was admitted with Klonopin and Flexeril overdose. Patient adamantly denies SI or overdose although she does not appear forthcoming with information or the amount of pills she ingested given known refill per PMP. Also family report that she has been severely depressed and concern for self harm. She appears to be minimizing her symptoms and has a constricted affect throughout interview. She warrants inpatient psychiatric hospitalization for stabilization and treatment.   Treatment Plan Summary: -Patient warrants inpatient psychiatric hospitalization given high risk of harm to self. -Continue bedside sitter.  -Continue to hold home medications given overdose until patient is medically stable. -EKG reviewed and QTc 477 on 9/29. Please closely monitor when starting or increasing QTc prolonging agents.  -Please pursue involuntary commitment if patient refuses voluntary psychiatric hospitalization or attempts to leave the hospital.  -Will sign off on patient at this time. Please consult psychiatry again as needed.     Disposition: Recommend psychiatric Inpatient admission when medically cleared.   Action/Plan:  Anticipate DC to inpatient psych as facilitated by CSW.   Expected Discharge Date:  07/08/18               Expected Discharge Plan:  Psychiatric Hospital  In-House Referral:  Clinical Social Work  Discharge planning Services  CM Consult  Post Acute Care Choice:    Choice offered to:     DME Arranged:    DME Agency:     HH Arranged:    HH Agency:     Status of Service:  Completed, signed off  If discussed at [employer], dates discussed:    Additional Comments:  Lawerance Sabal, RN 07/03/2018, 11:08 AM

## 2018-07-03 NOTE — Progress Notes (Signed)
  PROGRESS NOTE  Kim Massey FEX:[id number] DOB: [birth year] DOA: 06/30/2018 PCP: Rosealee Albee, MD (Inactive)  Brief Narrative: 52 year old woman found on floor by roommate with 2 empty pill bottles (cyclobenzaprine, clonazepam).  Per chart patient with history of suicide attempt in the past.  Patient was intubated, admitted by critical care for drug overdose, aspiration pneumonia, metabolic acidosis, prolonged QT, acute respiratory failure with hypercapnia.  Treated for pneumonia.  Extubated 9/30 and transferred to hospitalist care 10/1.  Assessment/Plan Acute toxic metabolic encephalopathy, complicated by bipolar disorder, suspected overdose with Klonopin, cyclobenzaprine.  Urine drug screen positive for benzodiazepines, opiates, amphetamines. --Encephalopathy resolved. --Psychiatry recommended inpatient psychiatric hospitalization given high risk of harm to self, continue bedside sitter, pursuing involuntary commitment if patient refuses voluntary hospitalization  Acute hypercapnic respiratory failure with respiratory acidosis on admission --Resolved.  Lobar pneumonia, CVID --Asymptomatic.  Change to oral antibiotics.  Prolonged QT --Resolved.  AKI with metabolic acidosis secondary to hypovolemia, rhabdomyolysis --Renal function within normal limits.  CK trending down.  No sequela noted.  Fall at home with resulting rhabdomyolysis --Supportive care  Transaminitis, presumably secondary to rhabdomyolysis, hypotension.  Hepatitis panel negative.  Right upper quadrant ultrasound unremarkable. --No further evaluation suggested.  Bipolar --Per psychiatry   Remains medically stable for transfer to inpatient psychiatric facility  Patient has lots of questions about psychiatrist recommendations, will ask psychiatrist to come discuss recommendations with patient  DVT prophylaxis: SCDs Code Status: Full Family Communication:  Disposition Plan: inpatient psych    Brendia Sacks, MD  [facility] Direct contact: [phone] --Via amion app OR  --[email or web address]; password [email or web address]  7PM-7AM contact night coverage as above 07/03/2018, 7:34 PM  LOS: 3 days   Consultants:  Psychiatry   Procedures:    Antimicrobials:  Augmentin  Interval history/Subjective: Feels well, no complaints. Lots of questions about psychiatrist's recommendations.  Objective: Vitals:  Vitals:   07/03/18 0430 07/03/18 0856  BP: 107/69 116/79  Pulse: 87   Resp: 18 18  Temp: 98.8 F (37.1 C)   SpO2: 97%     Exam:  Constitutional:  . Appears calm and comfortable Respiratory:  . CTA bilaterally, no w/r/r except few crackles posteriorly. Marland Kitchen Respiratory effort normal.  Cardiovascular:  . RRR, no m/r/g Psychiatric:  . Mental status o Mood, affect appropriate  I have personally reviewed the following:   Data: . CBG stable . Basic metabolic panel unremarkable . AST, ALT without significant change, modestly elevated.  Total bilirubin and alkaline phosphatase within normal limits. . CK trending down . Hemoglobin stable 10 point  Scheduled Meds: . amoxicillin-clavulanate  1 tablet Oral Q12H  . lidocaine  1 patch Transdermal Q24H   Continuous Infusions: . sodium chloride 125 mL/hr at 07/03/18 1757    Principal Problem:   Overdose Active Problems:   Pneumonia of right upper lobe due to infectious organism [facility])   Rhabdomyolysis   Fall at home, initial encounter   Transaminitis   LOS: 3 days

## 2018-07-04 ENCOUNTER — Encounter (HOSPITAL_COMMUNITY): Payer: Self-pay

## 2018-07-04 ENCOUNTER — Other Ambulatory Visit: Payer: Self-pay | Admitting: Psychiatry

## 2018-07-04 ENCOUNTER — Other Ambulatory Visit: Payer: Self-pay

## 2018-07-04 ENCOUNTER — Inpatient Hospital Stay (HOSPITAL_COMMUNITY)
Admission: AD | Admit: 2018-07-04 | Discharge: 2018-07-07 | DRG: 885 | Disposition: A | Discharge status: Home / Self Care | Payer: Medicare Other | Source: Intra-hospital | Attending: Psychiatry | Admitting: Psychiatry

## 2018-07-04 DIAGNOSIS — M6282 Rhabdomyolysis: Secondary | ICD-10-CM | POA: Diagnosis present

## 2018-07-04 DIAGNOSIS — M545 Low back pain: Secondary | ICD-10-CM | POA: Diagnosis present

## 2018-07-04 DIAGNOSIS — W19XXXA Unspecified fall, initial encounter: Secondary | ICD-10-CM | POA: Diagnosis not present

## 2018-07-04 DIAGNOSIS — D839 Common variable immunodeficiency, unspecified: Secondary | ICD-10-CM | POA: Diagnosis present

## 2018-07-04 DIAGNOSIS — Z888 Allergy status to other drugs, medicaments and biological substances status: Secondary | ICD-10-CM | POA: Diagnosis not present

## 2018-07-04 DIAGNOSIS — G9341 Metabolic encephalopathy: Secondary | ICD-10-CM | POA: Diagnosis not present

## 2018-07-04 DIAGNOSIS — K5090 Crohn's disease, unspecified, without complications: Secondary | ICD-10-CM | POA: Diagnosis present

## 2018-07-04 DIAGNOSIS — G8929 Other chronic pain: Secondary | ICD-10-CM | POA: Diagnosis present

## 2018-07-04 DIAGNOSIS — K449 Diaphragmatic hernia without obstruction or gangrene: Secondary | ICD-10-CM | POA: Diagnosis present

## 2018-07-04 DIAGNOSIS — Z9049 Acquired absence of other specified parts of digestive tract: Secondary | ICD-10-CM | POA: Diagnosis not present

## 2018-07-04 DIAGNOSIS — I10 Essential (primary) hypertension: Secondary | ICD-10-CM | POA: Diagnosis present

## 2018-07-04 DIAGNOSIS — Z886 Allergy status to analgesic agent status: Secondary | ICD-10-CM | POA: Diagnosis not present

## 2018-07-04 DIAGNOSIS — Z818 Family history of other mental and behavioral disorders: Secondary | ICD-10-CM | POA: Diagnosis not present

## 2018-07-04 DIAGNOSIS — T481X2A Poisoning by skeletal muscle relaxants [neuromuscular blocking agents], intentional self-harm, initial encounter: Secondary | ICD-10-CM | POA: Diagnosis not present

## 2018-07-04 DIAGNOSIS — Z96612 Presence of left artificial shoulder joint: Secondary | ICD-10-CM | POA: Diagnosis present

## 2018-07-04 DIAGNOSIS — F419 Anxiety disorder, unspecified: Secondary | ICD-10-CM | POA: Diagnosis present

## 2018-07-04 DIAGNOSIS — Y92009 Unspecified place in unspecified non-institutional (private) residence as the place of occurrence of the external cause: Secondary | ICD-10-CM

## 2018-07-04 DIAGNOSIS — K219 Gastro-esophageal reflux disease without esophagitis: Secondary | ICD-10-CM | POA: Diagnosis present

## 2018-07-04 DIAGNOSIS — Z915 Personal history of self-harm: Secondary | ICD-10-CM

## 2018-07-04 DIAGNOSIS — J9602 Acute respiratory failure with hypercapnia: Secondary | ICD-10-CM | POA: Diagnosis not present

## 2018-07-04 DIAGNOSIS — R45851 Suicidal ideations: Secondary | ICD-10-CM | POA: Diagnosis present

## 2018-07-04 DIAGNOSIS — Z90710 Acquired absence of both cervix and uterus: Secondary | ICD-10-CM

## 2018-07-04 DIAGNOSIS — R740 Nonspecific elevation of levels of transaminase and lactic acid dehydrogenase [LDH]: Secondary | ICD-10-CM

## 2018-07-04 DIAGNOSIS — T1491XA Suicide attempt, initial encounter: Secondary | ICD-10-CM | POA: Diagnosis not present

## 2018-07-04 DIAGNOSIS — F314 Bipolar disorder, current episode depressed, severe, without psychotic features: Secondary | ICD-10-CM | POA: Diagnosis present

## 2018-07-04 DIAGNOSIS — W19XXXD Unspecified fall, subsequent encounter: Secondary | ICD-10-CM | POA: Diagnosis present

## 2018-07-04 DIAGNOSIS — T424X2A Poisoning by benzodiazepines, intentional self-harm, initial encounter: Secondary | ICD-10-CM | POA: Diagnosis not present

## 2018-07-04 DIAGNOSIS — J690 Pneumonitis due to inhalation of food and vomit: Secondary | ICD-10-CM | POA: Diagnosis present

## 2018-07-04 DIAGNOSIS — T50904A Poisoning by unspecified drugs, medicaments and biological substances, undetermined, initial encounter: Secondary | ICD-10-CM | POA: Diagnosis not present

## 2018-07-04 LAB — GLUCOSE, CAPILLARY
GLUCOSE-CAPILLARY: 83 mg/dL (ref 70–99)
Glucose-Capillary: 102 mg/dL — ABNORMAL HIGH (ref 70–99)
Glucose-Capillary: 104 mg/dL — ABNORMAL HIGH (ref 70–99)
Glucose-Capillary: 84 mg/dL (ref 70–99)

## 2018-07-04 MED ORDER — TRAZODONE HCL 50 MG PO TABS
50.0000 mg | ORAL_TABLET | Freq: Every evening | ORAL | Status: DC | PRN
  Administered 2018-07-04 – 2018-07-06 (×2): 50 mg via ORAL
  Filled 2018-07-04 (×3): qty 1

## 2018-07-04 MED ORDER — ALUM & MAG HYDROXIDE-SIMETH 200-200-20 MG/5ML PO SUSP: 30.0000 mL | ORAL | Status: DC | PRN

## 2018-07-04 MED ORDER — AMOXICILLIN-POT CLAVULANATE 875-125 MG PO TABS
1.0000 | ORAL_TABLET | Freq: Two times a day (BID) | ORAL | Status: DC
  Administered 2018-07-04 – 2018-07-05 (×2): 1 via ORAL
  Filled 2018-07-04 (×8): qty 1

## 2018-07-04 MED ORDER — HYDROXYZINE HCL 25 MG PO TABS
25.0000 mg | ORAL_TABLET | Freq: Four times a day (QID) | ORAL | Status: DC | PRN
  Administered 2018-07-04 – 2018-07-06 (×3): 25 mg via ORAL
  Filled 2018-07-04 (×3): qty 1

## 2018-07-04 MED ORDER — ACETAMINOPHEN 325 MG PO TABS
650.0000 mg | ORAL_TABLET | Freq: Four times a day (QID) | ORAL | Status: DC | PRN
  Administered 2018-07-04 – 2018-07-07 (×9): 650 mg via ORAL
  Filled 2018-07-04 (×9): qty 2

## 2018-07-04 MED ORDER — AMOXICILLIN-POT CLAVULANATE 875-125 MG PO TABS: 1.0000 | ORAL_TABLET | Freq: Two times a day (BID) | ORAL | Status: DC

## 2018-07-04 MED ORDER — MAGNESIUM HYDROXIDE 400 MG/5ML PO SUSP: 30.0000 mL | Freq: Every day | ORAL | Status: DC | PRN

## 2018-07-04 NOTE — Progress Notes (Signed)
Patient left by wheelchair with an escort to [facility]. AVS was printed and given to patient. IV was taken out.   Lillia Pauls RN

## 2018-07-04 NOTE — Clinical Social Work Note (Signed)
Patient discharging to [facility], transported by [employer]. Patient agreeable to going to [facility] voluntarily and Voluntary Admission Form Completed and transmitted to [facility]. Patient's boyfriend Orlie Dakin at the bedside. CSW signing off as no other SW intervention services needed at this time.  Genelle Bal, MSW, LCSW Licensed Clinical Social Worker Clinical Social Work Department [employer] [phone]

## 2018-07-04 NOTE — Tx Team (Signed)
Initial Treatment Plan 07/04/2018 3:25 PM Kim Massey [id number]:[id number]    PATIENT STRESSORS: Health problems Marital or family conflict Substance abuse   PATIENT STRENGTHS: Ability for insight Average or above average intelligence Communication skills Supportive family/friends   PATIENT IDENTIFIED PROBLEMS: "teach self not to worry"  "forgiveness"  Suicidal Ideation  Depression  Anxiety  Polysubstance Abuse           DISCHARGE CRITERIA:  Adequate post-discharge living arrangements Medical problems require only outpatient monitoring Verbal commitment to aftercare and medication compliance  PRELIMINARY DISCHARGE PLAN: Outpatient therapy Return to previous living arrangement  PATIENT/FAMILY INVOLVEMENT: This treatment plan has been presented to and reviewed with the patient, Kim Massey, and/or family member.  The patient and family have been given the opportunity to ask questions and make suggestions.  Clarene Critchley, RN 07/04/2018, 3:25 PM

## 2018-07-04 NOTE — Progress Notes (Signed)
Pt is observed in the dayroom, seen writing in workbook. Pt appears irritable/anxious/depressed/preocuppied/tearful in affect and mood. Pt denies SI/HI/AVH at this time. Rates pain 8/10; Back. Pt states she wants to speak with a provider tomorrow regarding her Cymbalta. Pt c/o of anxiety and insomnia. Provider on call notified. New orders obtained. Support and encouragement provided. Will continue with POC.

## 2018-07-04 NOTE — Progress Notes (Signed)
Patient ID: Kim Massey, female   DOB: 1966/02/12, 52 y.o.   MRN: [id number] Per State regulations 482.30 this chart was reviewed for medical necessity with respect to the patient's admission/duration of stay.    Next review date: 07/08/18  Thurman Coyer, BSN, RN-BC  Case Manager

## 2018-07-04 NOTE — Progress Notes (Signed)
Admission Note: Patient is a 52 year old female admitted to the unit with symptoms of depression, anxiety and suicidal ideation.  Patient currently denies suicidal ideation.  Presents with flat affect and depressed mood.  Patient was tearful during assessment.  States her grandchildren have been taken away from her and she is very concerned about their wellbeing.   States goal for hospitalization is to teach herself self control and forgiveness.  Admission plan of care reviewed and consent signed.  Skin assessment and personal belongings completed.  Old bruises and incisions noted on both wrists and legs.  No contraband found.  Patient oriented to the unit, staff and room.  Routine safety checks initiated.  Verbalizes understanding of unit rules and protocols.  Patient is safe on the unit.

## 2018-07-04 NOTE — Progress Notes (Signed)
Adult Psychoeducational Group Note  Date:  07/04/2018 Time:  9:08 PM  Group Topic/Focus:  Wrap-Up Group:   The focus of this group is to help patients review their daily goal of treatment and discuss progress on daily workbooks.  Participation Level:  Active  Participation Quality:  Appropriate  Affect:  Appropriate  Cognitive:  Appropriate  Insight: Appropriate  Engagement in Group:  Engaged  Modes of Intervention:  Discussion  Additional Comments:  Pt stated she just got out of the hospital and rated her day at a 4/10.  Aislinn Feliz 07/04/2018, 9:08 PM

## 2018-07-04 NOTE — Discharge Summary (Signed)
Physician Discharge Summary  Kim Massey ZOX:[id number] DOB: [birth year] DOA: 06/30/2018  PCP: Rosealee Albee, MD (Inactive)  Admit date: 06/30/2018 Discharge date: 07/04/2018  Recommendations for Outpatient Follow-up:   Transaminitis, presumably secondary to rhabdomyolysis, hypotension.  Hepatitis panel negative.  Right upper quadrant ultrasound unremarkable. --No further inpatient evaluation suggested.  Suggest repeat CMP 1-2 weeks.   Recommend following up with PCP 1-2 weeks after discharge from psychiatric facility.  Discharge Diagnoses:  1. Acute toxic metabolic encephalopathy, complicated by bipolar disorder, suspected overdose with Klonopin, cyclobenzaprine.   2. Acute hypercapnic respiratory failure with respiratory acidosis on admission 3. Lobar pneumonia 4. CVID 5. AKI with metabolic acidosis secondary to hypovolemia, rhabdomyolysis 6. Fall at home with resulting rhabdomyolysis 7. Transaminitis 8. Bipolar disorder  Discharge Condition: improved Disposition: [facility], inpatient psychiatric treatment  Diet recommendation: regular  Filed Weights   07/01/18 0248 07/03/18 0500 07/04/18 0500  Weight: 66.1 kg 70.6 kg 76 kg    History of present illness:  52 year old woman found on floor by roommate with 2 empty pill bottles (cyclobenzaprine, clonazepam).  Per chart patient with history of suicide attempt in the past.  Patient was intubated, admitted by critical care for drug overdose, aspiration pneumonia, metabolic acidosis, prolonged QT, acute respiratory failure with hypercapnia.    Hospital Course:  Treated for pneumonia.  Condition rapidly improved. Extubated 9/30 and transferred to hospitalist care 10/1.  Pneumonia clinically resolved as his metabolic acidosis.  QT within normal limits.  Seen by psychiatry with recommendation for inpatient psychiatric treatment.  Patient medically clear.  Individual issues as below.  Acute toxic metabolic encephalopathy, complicated  by bipolar disorder, suspected overdose with Klonopin, cyclobenzaprine.  Urine drug screen positive for benzodiazepines, opiates, amphetamines. --Encephalopathy resolved with supportive care. --Psychiatry recommended inpatient psychiatric hospitalization given high risk of harm to self, continue bedside sitter, pursuing involuntary commitment if patient refuses voluntary hospitalization  Acute hypercapnic respiratory failure with respiratory acidosis on admission --Resolved.  Secondary to overdose.  Lobar pneumonia, CVID --Appears clinically resolved.  No hypoxia.  Finish short course of oral antibiotic.  Prolonged QT --Resolved.  AKI with metabolic acidosis secondary to hypovolemia, rhabdomyolysis --Renal function within normal limits.  CK trending down.  No sequela noted.  Fall at home with resulting rhabdomyolysis --CK trending down as of yesterday.  Expect spontaneous resolution at this point.  Patient encouraged to drink fluids.  Transaminitis, presumably secondary to rhabdomyolysis, hypotension.  Hepatitis panel negative.  Right upper quadrant ultrasound unremarkable. --No further inpatient evaluation suggested.  Suggest repeat CMP 1-2 weeks.  Bipolar disorder --Per psychiatry  Consultants:  Psychiatry   Procedures:    Antimicrobials:  Zosyn 9/29 > 10/2  Augmentin 10/2 > 10/4  Today's assessment: S: feels ok, still coughing.  Urinating normally.  Urine appears normal. O: Vitals:  Vitals:   07/04/18 0449 07/04/18 0758  BP: 117/74 140/88  Pulse: 90 93  Resp: 20   Temp: 98.9 F (37.2 C) 98.7 F (37.1 C)  SpO2: 97% 93%    Constitutional:  . Appears calm and comfortable Respiratory:  . CTA bilaterally, no w/r/r.  . Respiratory effort normal.  Cardiovascular:  . RRR, no m/r/g . No LE extremity edema   Psychiatric:  . Mental status o Mood, affect appropriate  CBG stable  Discharge Instructions  Discharge Instructions    Activity as  tolerated - No restrictions   Complete by:  As directed    Diet general   Complete by:  As directed    Discharge instructions  Complete by:  As directed    Call your physician or seek immediate medical attention for pain, difficulty urinating or worsening of condition.     Allergies as of 07/04/2018      Reactions   Nsaids Other (See Comments)   Due to crohn's disease   Propoxyphene Nausea And Vomiting   Aspirin Other (See Comments)   bleeding      Medication List    STOP taking these medications   acetaminophen 500 MG tablet Commonly known as:  TYLENOL   baclofen 10 MG tablet Commonly known as:  LIORESAL   clonazePAM 0.5 MG tablet Commonly known as:  KLONOPIN   cyclobenzaprine 5 MG tablet Commonly known as:  FLEXERIL   DULoxetine 60 MG capsule Commonly known as:  CYMBALTA   gabapentin 100 MG capsule Commonly known as:  NEURONTIN   gabapentin 300 MG capsule Commonly known as:  NEURONTIN   HYDROcodone-acetaminophen 5-325 MG tablet Commonly known as:  NORCO/VICODIN     TAKE these medications   amoxicillin-clavulanate 875-125 MG tablet Commonly known as:  AUGMENTIN Take 1 tablet by mouth every 12 (twelve) hours. Last dose 10/4 PM.   loratadine 10 MG tablet Commonly known as:  CLARITIN Take 10 mg by mouth daily as needed for allergies.   pantoprazole 40 MG tablet Commonly known as:  PROTONIX Take 40 mg by mouth daily.      Allergies  Allergen Reactions  . Nsaids Other (See Comments)    Due to crohn's disease  . Propoxyphene Nausea And Vomiting  . Aspirin Other (See Comments)    bleeding    The results of significant diagnostics from this hospitalization (including imaging, microbiology, ancillary and laboratory) are listed below for reference.    Significant Diagnostic Studies: Dg Knee 1-2 Views Right  Result Date: 07/01/2018 CLINICAL DATA:  Right knee pain EXAM: RIGHT KNEE - 1-2 VIEW COMPARISON:  None. FINDINGS: No fracture or malalignment small  knee effusion. Edema within the suprapatellar soft tissues. Joint spaces are maintained IMPRESSION: 1. No acute osseous abnormality 2. Small knee effusion 3. Edema within the suprapatellar soft tissues Electronically Signed   By: Jasmine Pang M.D.   On: 07/01/2018 16:31   Ct Head Wo Contrast  Result Date: 06/30/2018 CLINICAL DATA:  Altered LOC EXAM: CT HEAD WITHOUT CONTRAST TECHNIQUE: Contiguous axial images were obtained from the base of the skull through the vertex without intravenous contrast. COMPARISON:  02/18/2008 FINDINGS: Brain: No evidence of acute infarction, hemorrhage, hydrocephalus, extra-axial collection or mass lesion/mass effect. Vascular: No hyperdense vessel or unexpected calcification. Skull: Normal. Negative for fracture or focal lesion. Sinuses/Orbits: Postsurgical changes of the maxillary and ethmoid sinuses. Mild mucosal thickening in the maxillary and ethmoid sinuses. Other: None IMPRESSION: 1. Negative non contrasted CT appearance of the brain 2. Paranasal sinus disease Electronically Signed   By: Jasmine Pang M.D.   On: 06/30/2018 22:19   Dg Chest Port 1 View  Result Date: 07/02/2018 CLINICAL DATA:  Community acquired pneumonia EXAM: PORTABLE CHEST 1 VIEW COMPARISON:  06/30/2018 FINDINGS: Interval extubation and removal of NG tube. Right Port-A-Cath remains in place, unchanged. Right lung airspace disease again noted, most confluent in the right upper lobe but also noted at the right lung base, slightly increased at the right base. Left lung clear. Heart is borderline in size. IMPRESSION: Diffuse right lung airspace disease, increased at the right base. Findings likely reflect pneumonia. Borderline heart size. Electronically Signed   By: Charlett Nose M.D.   On: 07/02/2018  08:12   Dg Chest Portable 1 View  Result Date: 06/30/2018 CLINICAL DATA:  Altered mental status EXAM: PORTABLE CHEST 1 VIEW COMPARISON:  Report 09/05/2014 FINDINGS: Endotracheal tube tip is about 19 mm  superior to the carina. Esophageal tube tip overlies the proximal stomach. Right-sided central venous catheter tip overlies the proximal right atrium. Streaky atelectasis at the left base. Dense consolidation in the right upper lobe with air bronchograms. Patchy infiltrate at the peripheral right lung base. Mildly dilated bowel in the upper abdomen. IMPRESSION: 1. Endotracheal tube tip about 19 mm superior to the carina. Esophageal tube tip overlies the proximal stomach 2. Dense right upper lobe consolidation concerning for a pneumonia. Additional mild infiltrate at the right lateral lung base. Electronically Signed   By: Jasmine Pang M.D.   On: 06/30/2018 19:18   US Abdomen Limited Ruq  Result Date: 07/02/2018 CLINICAL DATA:  Elevated LFTs.  Previous cholecystectomy. EXAM: ULTRASOUND ABDOMEN LIMITED RIGHT UPPER QUADRANT COMPARISON:  None. FINDINGS: Gallbladder: Prior cholecystectomy. Common bile duct: Diameter: 5.9 mm. Liver: No focal lesion identified. Within normal limits in parenchymal echogenicity. Portal vein is patent on color Doppler imaging with normal direction of blood flow towards the liver. Small amount right pleural fluid. IMPRESSION: Previous cholecystectomy.  No acute findings. Electronically Signed   By: Elberta Fortis M.D.   On: 07/02/2018 19:27    Microbiology: Recent Results (from the past 240 hour(s))  Culture, blood (routine x 2)     Status: None (Preliminary result)   Collection Time: 06/30/18 10:04 PM  Result Value Ref Range Status   Specimen Description BLOOD LEFT ANTECUBITAL  Final   Special Requests   Final    BOTTLES DRAWN AEROBIC AND ANAEROBIC Blood Culture adequate volume   Culture   Final    NO GROWTH 4 DAYS Performed at [facility], [address], [place], [place] [address]    Report Status PENDING  Incomplete  Culture, Urine     Status: None   Collection Time: 07/01/18  2:01 AM  Result Value Ref Range Status   Specimen Description URINE, CATHETERIZED   Final   Special Requests NONE  Final   Culture   Final    NO GROWTH Performed at [facility], [address], [place], [place] [address]    Report Status 07/02/2018 FINAL  Final  Culture, blood (routine x 2)     Status: None (Preliminary result)   Collection Time: 07/01/18  2:28 AM  Result Value Ref Range Status   Specimen Description BLOOD LEFT BRACHIAL ARTERY  Final   Special Requests   Final    BOTTLES DRAWN AEROBIC AND ANAEROBIC Blood Culture adequate volume   Culture   Final    NO GROWTH 3 DAYS Performed at [facility], [address], [place], [place] [address]    Report Status PENDING  Incomplete  MRSA PCR Screening     Status: None   Collection Time: 07/01/18  2:28 AM  Result Value Ref Range Status   MRSA by PCR NEGATIVE NEGATIVE Final    Comment:        The GeneXpert MRSA Assay (FDA approved for NASAL specimens only), is one component of a comprehensive MRSA colonization surveillance program. It is not intended to diagnose MRSA infection nor to guide or monitor treatment for MRSA infections. Performed at [facility], [address] [place], [place] [address]      Labs: Basic Metabolic Panel: Recent Labs  Lab 06/30/18 1752 07/01/18 0201 07/02/18 0542 07/03/18 0412  NA 137 138  137 140 138  K 4.4 4.0  4.1 3.5 3.5  CL 103 111  110 109 112*  CO2 15* 16*  16* 23 24  GLUCOSE 96 147*  151* 110* 94  BUN 23* 18  18 6  5*  CREATININE 1.61* 0.96  0.99 0.66 0.70  CALCIUM 8.6* 6.8*  6.9* 8.5* 7.7*  MG 2.0 1.8  --   --    Liver Function Tests: Recent Labs  Lab 06/30/18 1752 07/01/18 0201 07/02/18 0542 07/03/18 0412  AST 185* 142*  144* 176* 165*  ALT 84* 66*  66* 76* 84*  ALKPHOS 61 41  40 47 46  BILITOT 1.3* 1.3*  1.3* 0.5 0.3  PROT 6.5 4.6*  4.9* 4.9* 5.2*  ALBUMIN 3.7 2.5*  2.5* 2.3* 2.2*   CBC: Recent Labs  Lab 06/30/18 1752 07/01/18 0201 07/02/18 0542 07/03/18 0412  WBC 6.1 7.2 8.8 8.1  NEUTROABS 4.5  --    --   --   HGB 16.5* 12.9 10.7* 10.1*  HCT 50.2* 39.0 32.6* 30.6*  MCV 100.2* 101.0* 100.9* 101.7*  PLT 249 175 155 152   Cardiac Enzymes: Recent Labs  Lab 07/01/18 0201 07/02/18 0542 07/03/18 0412  CKTOTAL 11,528* 8,407* 6,202*    CBG: Recent Labs  Lab 07/03/18 1210 07/03/18 1715 07/03/18 2009 07/04/18 0019 07/04/18 0445  GLUCAP 90 83 104* 84 102*    Principal Problem:   Overdose Active Problems:   Pneumonia of right upper lobe due to infectious organism [facility])   Rhabdomyolysis   Fall at home, initial encounter   Transaminitis   Time coordinating discharge: 25 minutes  Signed:  Brendia Sacks, MD [facility] 07/04/2018, 11:10 AM

## 2018-07-04 NOTE — [facility] Group Notes (Signed)
[facility] Group Notes:  (Nursing/MHT/Case Management/Adjunct)  Date:  07/04/2018  Time:  5:37 PM  Type of Therapy:  Psychoeducational Skills  Participation Level:  Active  Participation Quality:  Appropriate and Attentive  Affect:  Appropriate  Cognitive:  Alert and Appropriate  Insight:  Appropriate and Good  Engagement in Group:  Engaged  Modes of Intervention:  Discussion  Summary of Progress/Problems: Discussed Journey to Recovery.  Patient was attentive and receptive.  Kim Massey 07/04/2018, 5:37 PM

## 2018-07-05 DIAGNOSIS — T1491XA Suicide attempt, initial encounter: Secondary | ICD-10-CM

## 2018-07-05 DIAGNOSIS — F314 Bipolar disorder, current episode depressed, severe, without psychotic features: Principal | ICD-10-CM

## 2018-07-05 DIAGNOSIS — T424X2A Poisoning by benzodiazepines, intentional self-harm, initial encounter: Secondary | ICD-10-CM

## 2018-07-05 DIAGNOSIS — T481X2A Poisoning by skeletal muscle relaxants [neuromuscular blocking agents], intentional self-harm, initial encounter: Secondary | ICD-10-CM

## 2018-07-05 LAB — CULTURE, BLOOD (ROUTINE X 2)
Culture: NO GROWTH
Special Requests: ADEQUATE

## 2018-07-05 MED ORDER — DULOXETINE HCL 20 MG PO CPEP
40.0000 mg | ORAL_CAPSULE | Freq: Every day | ORAL | Status: DC
  Administered 2018-07-05 – 2018-07-07 (×3): 40 mg via ORAL
  Filled 2018-07-05 (×6): qty 2

## 2018-07-05 NOTE — BHH Counselor (Addendum)
Adult Comprehensive Assessment  Patient ID: Kim Massey, female   DOB: 1966/06/15, 52 y.o.   MRN: [id number]  Information Source: Information source: Patient  Current Stressors:  Patient states their primary concerns and needs for treatment are:: Letting go of her current circumstances.  "stop being a worrier" Patient states their goals for this hospitilization and ongoing recovery are:: same as above.   Family Relationships: Pt had custody of her 3 grandchildren for 4 years and they were returned to their father by the court in June 2019.   Physical health (include injuries & life threatening diseases): Pt has back problems, scheduled for surgery this past week prior to her overdose.    Living/Environment/Situation:  Living Arrangements: Spouse/significant other Living conditions (as described by patient or guardian): good situation, safe Who else lives in the home?: fiancee How long has patient lived in current situation?: 5 months What is atmosphere in current home: Comfortable, Supportive  Family History:  Marital status: Divorced Divorced, when?: 12 years What types of issues is patient dealing with in the relationship?: Currently engaged--with fiancee for 3 years.  Going well.   Are you sexually active?: Yes What is your sexual orientation?: heterosexual Has your sexual activity been affected by drugs, alcohol, medication, or emotional stress?: no Does patient have children?: Yes How many children?: 1 How is patient's relationship with their children?: daughter, good relationship  Childhood History:  By whom was/is the patient raised?: Both parents Additional childhood history information: Parents stayed married.  "great" childhood, but daddy was a Programmer, multimedia and was strict. Description of patient's relationship with caregiver when they were a child: mom: great, dad: great Patient's description of current relationship with people who raised him/her: both deceased How were  you disciplined when you got in trouble as a child/adolescent?: appropriate physical discipline Does patient have siblings?: Yes Number of Siblings: 1 Description of patient's current relationship with siblings: older sister, younger brother. Good relationships Did patient suffer any verbal/emotional/physical/sexual abuse as a child?: No Did patient suffer from severe childhood neglect?: No Has patient ever been sexually abused/assaulted/raped as an adolescent or adult?: No Was the patient ever a victim of a crime or a disaster?: No Witnessed domestic violence?: No(parents argued a lot) Has patient been effected by domestic violence as an adult?: No  Education:  Highest grade of school patient has completed: HS diploma, Armed forces operational officer degree Currently a student?: No Learning disability?: No  Employment/Work Situation:   Employment situation: On disability Why is patient on disability: crohn's disease How long has patient been on disability: 11 years Patient's job has been impacted by current illness: (na) What is the longest time patient has a held a job?: 11 years 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?: No Are There Guns or Other Weapons in Your Home?: No  Financial Resources:   Surveyor, quantity resources: [employer], Income from spouse, Medicare Does patient have a Lawyer or guardian?: No  Alcohol/Substance Abuse:   What has been your use of drugs/alcohol within the last 12 months?: alcohol: 1x week, 2 beers, drugs: pt denies If attempted suicide, did drugs/alcohol play a role in this?: (na) Alcohol/Substance Abuse Treatment Hx: Denies past history Has alcohol/substance abuse ever caused legal problems?: No  Social Support System:   Patient's Community Support System: Good Describe Community Support System: fiancee, friends Type of faith/religion:  Christian/Baptist How does patient's faith help to cope with current illness?:  I pray a lot  Leisure/Recreation:   Leisure and Hobbies: ride horses, putt putt golf  Strengths/Needs:   What is the patient's perception of their strengths?: i'm a motivator,  Patient states they can use these personal strengths during their treatment to contribute to their recovery: listen to my own advice, forgive and stop worrying Patient states these barriers may affect/interfere with their treatment: none Patient states these barriers may affect their return to the community: none Other important information patient would like considered in planning for their treatment: none  Discharge Plan:   Currently receiving community mental health services: No Patient states concerns and preferences for aftercare planning are: Pt does not want follow up. Patient states they will know when they are safe and ready for discharge when: Pt reports she feels safe now. Does patient have access to transportation?: Yes Does patient have financial barriers related to discharge medications?: No Will patient be returning to same living situation after discharge?: Yes  Summary/Recommendations:   Summary and Recommendations (to be completed by the evaluator): Pt is 52 year old female from [place].  [facility])  Pt is diagnosed with major depressive disorder.  Pt was admitted from the medical floor after an overdose of medication.  Recommendations for pt include crisis stabilization, therapeutic milieu, attend and participate in groups, medication management, and development of comprehensive mental wellness plan.  Lorri Frederick. 07/05/2018

## 2018-07-05 NOTE — H&P (Addendum)
Psychiatric Admission Assessment Adult  Patient Identification: Kim Massey MRN:  [id number] Date of Evaluation:  07/05/2018 Chief Complaint:  " I was not trying to kill myself " Principal Diagnosis: S/P Overdose  Diagnosis:   Patient Active Problem List   Diagnosis Date Noted  . Bipolar affective disorder, depressed, severe (HCC) [F31.4] 07/04/2018  . Rhabdomyolysis [M62.82] 07/02/2018  . Fall at home, initial encounter [W19.Lorne Skeens, Y92.009] 07/02/2018  . Transaminitis [R74.0] 07/02/2018  . Pneumonia of right upper lobe due to infectious organism (HCC) [J18.1]   . Overdose [T50.901A] 06/30/2018  . STYE [H00.019] 11/04/2010  . URI [J06.9] 11/04/2010  . BICEPS TENDINITIS [M75.20] 11/04/2010   History of Present Illness: 52 year old female, presented to ED via EMS after being found cyanotic and unresponsive by a roommate, with empty medication bottles at side ( cyclobenzaprine and clonazepam). Overdose was severe and required intubation and management in ICU setting.   Patient states " it was not a suicide attempt, it was an accident ". States that prior to episode had in fact been out to dinner with friends and had " a great time". States " I was in a good mood " and states she was not feeling depressed or suicidal .Reports she took the medications because she had earlier fallen while trying to hang a picture at home, resulting in some  increased back and knee pain.  She denies any significant neuro-vegetative symptoms of depression. Denies anhedonia or changes in sleep, appetite, energy level, and categorically denies any suicidal ideations. Also denies any psychotic symptoms.    Associated Signs/Symptoms: Depression Symptoms: denies anhedonia, denies sadness, denies changes in sleep, appetite, energy level  (Hypo) Manic Symptoms:  None noted or endorsed  Anxiety Symptoms:  Denies increased anxiety  Psychotic Symptoms:  Denies  PTSD Symptoms: Denies  Total Time spent with patient:  45 minutes  Past Psychiatric History: one prior psychiatric admission 10 years ago for depression in the context of divorce/separation. Denies other episodes of severe depression and denies history of mania. Denies history of prior suicidal attempts, denies history of self cutting or self injurious behaviors, denies history of psychosis , denies panic or agoraphobia, denies history of genrealized or excessive anxiety, denies history of violence  Is the patient at risk to self? Yes.    Has the patient been a risk to self in the past 6 months? No.  Has the patient been a risk to self within the distant past? No.  Is the patient a risk to others? No.  Has the patient been a risk to others in the past 6 months? No.  Has the patient been a risk to others within the distant past? No.   Prior Inpatient Therapy:  as above  Prior Outpatient Therapy:  reports psychiatric medications prescribed by PCP  Alcohol Screening: 1. How often do you have a drink containing alcohol?: 2 to 3 times a week 2. How many drinks containing alcohol do you have on a typical day when you are drinking?: 1 or 2 3. How often do you have six or more drinks on one occasion?: Never AUDIT-C Score: 3 4. How often during the last year have you found that you were not able to stop drinking once you had started?: Never 5. How often during the last year have you failed to do what was normally expected from you becasue of drinking?: Never 6. How often during the last year have you needed a first drink in the morning to get yourself  going after a heavy drinking session?: Never 7. How often during the last year have you had a feeling of guilt of remorse after drinking?: Never 8. How often during the last year have you been unable to remember what happened the night before because you had been drinking?: Never 9. Have you or someone else been injured as a result of your drinking?: No 10. Has a relative or friend or a doctor or another  health worker been concerned about your drinking or suggested you cut down?: No Alcohol Use Disorder Identification Test Final Score (AUDIT): 3 Substance Abuse History in the last 12 months:  Denies alcohol or drug abuse . States she had recently been prescribed Oxycodone 2 weeks ago,for back pain, denies abusing . Consequences of Substance Abuse: Denies  Previous Psychotropic Medications: Reports she has been on Cymbalta 60 mgrs daily x 10 years, states this medication has been helpful , does not cause side effects. Also prescribed Klonopin 0.5 mgrs BID PRN for anxiety, states was taking less than prescribed, about one tablet once or twice a week.  Also takes Topamax ( dose?) for migraine prophylaxis . Reports she has been tried on Neurontin for pain, but it caused excessive sedation, " feeling loopy". 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 Past Medical History:  Diagnosis Date  . Anxiety   . Arthritis    "lower back; upper neck" (07/03/2018)  . Aspiration pneumonia due to vomit (HCC) 06/30/2018   [name] 06/30/2018  . Bipolar disorder (HCC)   . Chronic lower back pain   . CVID (common variable immunodeficiency) (HCC)    dx'd 2009/notes 06/30/2018  . Depression   . Drug overdose, intentional (HCC) 06/30/2018   suspected; empty pill bottles at the bedside; empty bottles of clonazepam and Flexeril; Utox Positive for Opiates, Amphetamines and Benzodiazepines/notes 06/30/2018  . GERD (gastroesophageal reflux disease)   . History of hiatal hernia   . History of kidney stones   . Hypertension    [name] 06/30/2018  . Pneumonia    "twice" (07/03/2018)    Past Surgical History:  Procedure Laterality Date  . ABDOMINAL HYSTERECTOMY    . APPENDECTOMY    . CARPAL TUNNEL RELEASE Bilateral   . CHOLECYSTECTOMY OPEN    . FACET JOINT INJECTION  07/02/2018   BILATERAL FACET/PARAVERT JOINT INJECTION-L4-5, L5-S1  [name] 07/02/2018  . HERNIA REPAIR    . NISSEN FUNDOPLICATION    . TONSILLECTOMY    . TOTAL SHOULDER REPLACEMENT Left   . WRIST FRACTURE SURGERY Bilateral    Family History: parents deceased, mother died at age 74 of pneumonia, father died in 70 from MI Family Psychiatric  History: brother attempted suicide, a nephew died from accidental opiate overdose, denies history of alcohol use disorder in family Tobacco Screening:  does not smoke or vape  Social History: 40, divorced, lives with fiance, has one adult daughter, on disability, she reports she had been taking care of her grandchildren up to July 1st, when they went back to their father. Social History   Substance and Sexual Activity  Alcohol Use Not Currently     Social History   Substance and Sexual Activity  Drug Use Yes  . Types: Barbituates, Benzodiazepines, Hydrocodone    Additional Social History: Marital status: Divorced Divorced, when?: 12 years What types of issues is patient dealing with in the relationship?: Currently engaged--with fiancee for 3 years.  Going well.   Are you sexually  active?: Yes What is your sexual orientation?: heterosexual Has your sexual activity been affected by drugs, alcohol, medication, or emotional stress?: no Does patient have children?: Yes How many children?: 1 How is patient's relationship with their children?: daughter, good relationship  Allergies:   Allergies  Allergen Reactions  . Nsaids Other (See Comments)    Due to crohn's disease  . Propoxyphene Nausea And Vomiting  . Aspirin Other (See Comments)    bleeding   Lab Results:  Results for orders placed or performed during the hospital encounter of 06/30/18 (from the past 48 hour(s))  Glucose, capillary     Status: None   Collection Time: 07/03/18  5:15 PM  Result Value Ref Range   Glucose-Capillary 83 70 - 99 mg/dL   Comment 1 Notify RN   Glucose, capillary     Status: Abnormal   Collection Time: 07/03/18  8:09 PM   Result Value Ref Range   Glucose-Capillary 104 (H) 70 - 99 mg/dL   Comment 1 Notify RN   Glucose, capillary     Status: None   Collection Time: 07/04/18 12:19 AM  Result Value Ref Range   Glucose-Capillary 84 70 - 99 mg/dL  Glucose, capillary     Status: Abnormal   Collection Time: 07/04/18  4:45 AM  Result Value Ref Range   Glucose-Capillary 102 (H) 70 - 99 mg/dL    Blood Alcohol level:  Lab Results  Component Value Date   ETH <10 06/30/2018   ETH (H) 08/21/2009    88        LOWEST DETECTABLE LIMIT FOR SERUM ALCOHOL IS 5 mg/dL FOR MEDICAL PURPOSES ONLY    Metabolic Disorder Labs:  No results found for: HGBA1C, MPG No results found for: PROLACTIN No results found for: CHOL, TRIG, HDL, CHOLHDL, VLDL, LDLCALC  Current Medications: Current Facility-Administered Medications  Medication Dose Route Frequency Provider Last Rate Last Dose  . acetaminophen (TYLENOL) tablet 650 mg  650 mg Oral Q6H PRN Nelly Rout, MD   650 mg at 07/05/18 1335  . alum & mag hydroxide-simeth (MAALOX/MYLANTA) 200-200-20 MG/5ML suspension 30 mL  30 mL Oral Q4H PRN Nelly Rout, MD      . amoxicillin-clavulanate (AUGMENTIN) 875-125 MG per tablet 1 tablet  1 tablet Oral Q12H Armandina Stammer I, NP   1 tablet at 07/05/18 0736  . hydrOXYzine (ATARAX/VISTARIL) tablet 25 mg  25 mg Oral Q6H PRN Nira Conn A, NP   25 mg at 07/04/18 2107  . magnesium hydroxide (MILK OF MAGNESIA) suspension 30 mL  30 mL Oral Daily PRN Nelly Rout, MD      . traZODone (DESYREL) tablet 50 mg  50 mg Oral QHS PRN Jackelyn Poling, NP   50 mg at 07/04/18 2107   PTA Medications: Medications Prior to Admission  Medication Sig Dispense Refill Last Dose  . amoxicillin-clavulanate (AUGMENTIN) 875-125 MG tablet Take 1 tablet by mouth every 12 (twelve) hours. Last dose 10/4 PM.     . loratadine (CLARITIN) 10 MG tablet Take 10 mg by mouth daily as needed for allergies.   unknown  . pantoprazole (PROTONIX) 40 MG tablet Take 40 mg by  mouth daily.   unknown    Musculoskeletal: Strength & Muscle Tone: within normal limits Gait & Station: normal Patient leans: N/A  Psychiatric Specialty Exam: Physical Exam  Review of Systems  Constitutional: Negative.   HENT: Negative.   Eyes: Negative.   Respiratory: Positive for cough.   Cardiovascular: Negative.   Gastrointestinal: Positive  for diarrhea. Negative for nausea and vomiting.  Genitourinary: Negative.   Musculoskeletal: Negative.        Reports history of lower back pain  Skin: Negative.   Neurological: Negative.   Endo/Heme/Allergies: Negative.   Psychiatric/Behavioral: Positive for depression and suicidal ideas.    Blood pressure 115/81, pulse (!) 108, temperature 99.4 F (37.4 C), temperature source Oral, resp. rate 20, height 5\' 2"  (1.575 m), weight 66.2 kg, SpO2 97 %.Body mass index is 26.7 kg/m.  General Appearance: Well Groomed  Eye Contact:  Good  Speech:  Normal Rate  Volume:  Normal  Mood:  Reports mood is "OK", states " I am not depressed "  Affect:  Appropriate and reactive  Thought Process:  Linear and Descriptions of Associations: Intact  Orientation:  Other:  fully alert and attentive  Thought Content:  no hallucinations, no delusions   Suicidal Thoughts:  No denies suicidal or self injurious ideations, denies any homicidal or violent ideations  Homicidal Thoughts:  No  Memory:  recent and remote grossly intact , although has limited recollection of events following overdose   Judgement:  Fair  Insight:  Fair  Psychomotor Activity:  no psychomotor agitation or restlessness   Concentration:  Concentration: Good and Attention Span: Good  Recall:  Good  Fund of Knowledge:  Good  Language:  Good  Akathisia:  Negative  Handed:  Right  AIMS (if indicated):     Assets:  Desire for Improvement Resilience  ADL's:  Intact  Cognition:  WNL  Sleep:  Number of Hours: 4.25    Treatment Plan Summary: Daily contact with patient to assess and  evaluate symptoms and progress in treatment, Medication management, Plan inpatient admission and medications as below  Observation Level/Precautions:  15 minute checks  Laboratory: as needed   Psychotherapy:  Milieu, group therapy   Medications:  Patient reports she has been on Cymbalta for years, and states it has been helpful and well tolerated . Restart Cymbalta at  40 mgrs QDAY .  As noted, reports BZD use was irregular, 2-3 times a week, and denies pattern of BZD abuse or current symptoms of WDL- therefore detox or taper not currently warranted  At her request start Protonix, which she states she had been taking prior to admission.   Consultations:  Patient reports some diarrhea, without any associated symptoms. She also reports a past history of CDIFF  . I have reviewed with hospitalist consultant - Dr. Unk Pinto, recommendation is to discontinue PO antibiotics  ( completing course ) and monitor - no current indication for isolation  Discharge Concerns:  -  Estimated LOS: 3-4 days   Other:     Physician Treatment Plan for Primary Diagnosis: S/P Overdose  Long Term Goal(s): Improvement in symptoms so as ready for discharge  Short Term Goals: Ability to identify changes in lifestyle to reduce recurrence of condition will improve and Ability to identify and develop effective coping behaviors will improve  Physician Treatment Plan for Secondary Diagnosis: As above  Long Term Goal(s): Improvement in symptoms so as ready for discharge  Short Term Goals: Ability to identify changes in lifestyle to reduce recurrence of condition will improve, Ability to verbalize feelings will improve, Ability to disclose and discuss suicidal ideas, Ability to demonstrate self-control will improve, Ability to identify and develop effective coping behaviors will improve and Ability to maintain clinical measurements within normal limits will improve  I certify that inpatient services furnished can reasonably be  expected to improve the  patient's condition.    Craige Cotta, MD 10/4/20192:29 PM

## 2018-07-05 NOTE — [facility] Suicide Risk Assessment (Signed)
[facility] Admission Suicide Risk Assessment   Nursing information obtained from:  Patient Demographic factors:  Low socioeconomic status Current Mental Status:  NA Loss Factors:  Decline in physical health Historical Factors:  NA Risk Reduction Factors:  Positive therapeutic relationship  Total Time spent with patient: 45 minutes Principal Problem:  S/P Overdose  Diagnosis:   Patient Active Problem List   Diagnosis Date Noted  . Bipolar affective disorder, depressed, severe (HCC) [F31.4] 07/04/2018  . Rhabdomyolysis [M62.82] 07/02/2018  . Fall at home, initial encounter [W19.Lorne Skeens, Y92.009] 07/02/2018  . Transaminitis [R74.0] 07/02/2018  . Pneumonia of right upper lobe due to infectious organism (HCC) [J18.1]   . Overdose [T50.901A] 06/30/2018  . STYE [H00.019] 11/04/2010  . URI [J06.9] 11/04/2010  . BICEPS TENDINITIS [M75.20] 11/04/2010   Subjective Data:   Continued Clinical Symptoms:  Alcohol Use Disorder Identification Test Final Score (AUDIT): 3 The "Alcohol Use Disorders Identification Test", Guidelines for Use in Primary Care, Second Edition.  World Science writer [facility]). Score between 0-7:  no or low risk or alcohol related problems. Score between 8-15:  moderate risk of alcohol related problems. Score between 16-19:  high risk of alcohol related problems. Score 20 or above:  warrants further diagnostic evaluation for alcohol dependence and treatment.   CLINICAL FACTORS:  52 year old female, S/P overdose (hydrocodone, clonazepam, cyclobenzaprine) on 9/29. Overdose was severe and required ICU admission, intubation. Patient states overdose was accidental, not intentional , and categorically denies any suicidal intent. States she was not feeling sad or depressed prior to above incident, and had not been having any suicidal or self injurious ideations. States she took medications in an effort to alleviate pain. Denies pattern of BZD abuse ( states she was taking Clonazepam 1-2  times a week only) or opiate abuse ( reports she had just recently been prescribed opiates for back pain).     Psychiatric Specialty Exam: Physical Exam  ROS  Blood pressure 115/81, pulse (!) 108, temperature 99.4 F (37.4 C), temperature source Oral, resp. rate 20, height 5\' 2"  (1.575 m), weight 66.2 kg, SpO2 97 %.Body mass index is 26.7 kg/m.  See admit note MSE    COGNITIVE FEATURES THAT CONTRIBUTE TO RISK:  Closed-mindedness and Loss of executive function    SUICIDE RISK:   Mild:  Suicidal ideation of limited frequency, intensity, duration, and specificity.  There are no identifiable plans, no associated intent, mild dysphoria and related symptoms, good self-control (both objective and subjective assessment), few other risk factors, and identifiable protective factors, including available and accessible social support.  PLAN OF CARE: Patient will be admitted to inpatient psychiatric unit for stabilization and safety. Will provide and encourage milieu participation. Provide medication management and maked adjustments as needed.  Will follow daily.    I certify that inpatient services furnished can reasonably be expected to improve the patient's condition.   Craige Cotta, MD 07/05/2018, 3:12 PM

## 2018-07-05 NOTE — Tx Team (Addendum)
Interdisciplinary Treatment and Diagnostic Plan Update  07/08/2018 Time of Session: 1030 [name] MRN: [id number]  Principal Diagnosis: Bipolar affective disorder, depressed, severe (HCC)  Secondary Diagnoses: Principal Problem:   Bipolar affective disorder, depressed, severe (HCC)   Current Medications:  No current facility-administered medications for this encounter.    Current Outpatient Medications  Medication Sig Dispense Refill  . DULoxetine 40 MG CPEP Take 40 mg by mouth daily. For mood control 30 capsule 0  . hydrOXYzine (ATARAX/VISTARIL) 25 MG tablet Take 1 tablet (25 mg total) by mouth every 6 (six) hours as needed for anxiety. 30 tablet 0  . traZODone (DESYREL) 50 MG tablet Take 1 tablet (50 mg total) by mouth at bedtime as needed for sleep. 30 tablet 0   PTA Medications: No medications prior to admission.    Patient Stressors: Health problems Marital or family conflict Substance abuse  Patient Strengths: Ability for insight Average or above average intelligence Communication skills Supportive family/friends  Treatment Modalities: Medication Management, Group therapy, Case management,  1 to 1 session with clinician, Psychoeducation, Recreational therapy.   Physician Treatment Plan for Primary Diagnosis: Bipolar affective disorder, depressed, severe (HCC) Long Term Goal(s): Improvement in symptoms so as ready for discharge Improvement in symptoms so as ready for discharge   Short Term Goals: Ability to identify changes in lifestyle to reduce recurrence of condition will improve Ability to identify and develop effective coping behaviors will improve Ability to identify changes in lifestyle to reduce recurrence of condition will improve Ability to verbalize feelings will improve Ability to disclose and discuss suicidal ideas Ability to demonstrate self-control will improve Ability to identify and develop effective coping behaviors will improve Ability  to maintain clinical measurements within normal limits will improve  Medication Management: Evaluate patient's response, side effects, and tolerance of medication regimen.  Therapeutic Interventions: 1 to 1 sessions, Unit Group sessions and Medication administration.  Evaluation of Outcomes: Progressing  Physician Treatment Plan for Secondary Diagnosis: Principal Problem:   Bipolar affective disorder, depressed, severe (HCC)  Long Term Goal(s): Improvement in symptoms so as ready for discharge Improvement in symptoms so as ready for discharge   Short Term Goals: Ability to identify changes in lifestyle to reduce recurrence of condition will improve Ability to identify and develop effective coping behaviors will improve Ability to identify changes in lifestyle to reduce recurrence of condition will improve Ability to verbalize feelings will improve Ability to disclose and discuss suicidal ideas Ability to demonstrate self-control will improve Ability to identify and develop effective coping behaviors will improve Ability to maintain clinical measurements within normal limits will improve     Medication Management: Evaluate patient's response, side effects, and tolerance of medication regimen.  Therapeutic Interventions: 1 to 1 sessions, Unit Group sessions and Medication administration.  Evaluation of Outcomes: Progressing   RN Treatment Plan for Primary Diagnosis: Bipolar affective disorder, depressed, severe (HCC) Long Term Goal(s): Knowledge of disease and therapeutic regimen to maintain health will improve  Short Term Goals: Ability to identify and develop effective coping behaviors will improve and Compliance with prescribed medications will improve  Medication Management: RN will administer medications as ordered by provider, will assess and evaluate patient's response and provide education to patient for prescribed medication. RN will report any adverse and/or side effects to  prescribing provider.  Therapeutic Interventions: 1 on 1 counseling sessions, Psychoeducation, Medication administration, Evaluate responses to treatment, Monitor vital signs and CBGs as ordered, Perform/monitor CIWA, COWS, AIMS and Fall Risk screenings as ordered,  Perform wound care treatments as ordered.  Evaluation of Outcomes: Progressing   LCSW Treatment Plan for Primary Diagnosis: Bipolar affective disorder, depressed, severe (HCC) Long Term Goal(s): Safe transition to appropriate next level of care at discharge, Engage patient in therapeutic group addressing interpersonal concerns.  Short Term Goals: Engage patient in aftercare planning with referrals and resources, Increase social support and Increase skills for wellness and recovery  Therapeutic Interventions: Assess for all discharge needs, 1 to 1 time with Social worker, Explore available resources and support systems, Assess for adequacy in community support network, Educate family and significant other(s) on suicide prevention, Complete Psychosocial Assessment, Interpersonal group therapy.  Evaluation of Outcomes: Progressing   Progress in Treatment: Attending groups: Yes. Participating in groups: Yes. Taking medication as prescribed: Yes. Toleration medication: Yes. Family/Significant other contact made: No, will contact:  fiancee Patient understands diagnosis: Yes. Discussing patient identified problems/goals with staff: Yes. Medical problems stabilized or resolved: Yes. Denies suicidal/homicidal ideation: Yes. Issues/concerns per patient self-inventory: No. Other: none  New problem(s) identified: No, Describe:  none  New Short Term/Long Term Goal(s):  Patient Goals:  "better coping skills"  Discharge Plan or Barriers:   Reason for Continuation of Hospitalization: Medication stabilization  Estimated Length of Stay: 2-4 days.  Attendees: Patient:Kim Massey 07/08/2018  Physician: Melvia Heaps, MD 07/08/2018    Nursing: Dewayne Shorter, RN 07/08/2018   RN Care Manager: 07/08/2018   Social Worker: Daleen Squibb, LCSW 07/08/2018   Recreational Therapist:  07/08/2018   Other:  07/08/2018   Other:  07/08/2018   Other: 07/08/2018     Scribe for Treatment Team: Lorri Frederick, LCSW 07/08/2018 8:06 AM

## 2018-07-05 NOTE — Progress Notes (Signed)
D: Patient is focused on discharge.  She denies any thoughts of self harm.  She states, "I asked the doctor to let me go home.  It was not a suicide attempt.  I just kept taking the pills to get rid of the pain."  Patient wants to see her grandchildren because she is having back surgery soon. Patient was pleased to restart her cymbalta.    A: Continue to monitor medication management and MD orders.  Safety checks completed every 15 minutes per protocol.  Offer support and encouragement as needed.  R: Patient is receptive to staff; her behavior is appropriate.

## 2018-07-05 NOTE — Progress Notes (Signed)
52 year old lady with prior h/o bipolar disorder, presented with overdose, underwent intubation and had prolonged respiratory issues, with pneumonia and discharged on oral augmentin to [facility] . Her last dose of antibiotic is today. She reports she has diarrhea, since this am, and also has a h/o of c diff in the past.  Recommend C Diff PCR panel if persistent diarrhea.  Please page [facility] for further needs.    Kathlen Mody MD [phone]

## 2018-07-05 NOTE — Progress Notes (Signed)
D: Pt denies SI/HI/AVH. Pt is pleasant and cooperative. Pt visible in dayroom with visitor this evening, pt presents with bright affect on approach.  A: Pt was offered support and encouragement. Pt was given scheduled medications. Pt was encourage to attend groups. Q 15 minute checks were done for safety.  R:Pt attends groups and interacts well with peers and staff. Pt is taking medication. Pt has no complaints.Pt receptive to treatment and safety maintained on unit.  Problem: Self-Concept: Goal: Level of anxiety will decrease Outcome: Progressing Note:  Pt was anxious about fiance not visiting yesterday, but feels better today due to him coming to see her   Problem: Education: Goal: Emotional status will improve Outcome: Progressing Note:  Pt stated she was feeling "much better"   Problem: Medication: Goal: Compliance with prescribed medication regimen will improve Outcome: Progressing Note:  Pt taking medications per MAR   Problem: Self-Concept: Goal: Ability to disclose and discuss suicidal ideas will improve Outcome: Progressing Note:  Pt denies SI at this time

## 2018-07-05 NOTE — [facility] Suicide Risk Assessment (Signed)
[facility] INPATIENT:  Family/Significant Other Suicide Prevention Education  Suicide Prevention Education:  Education Completed; Daine Floras, [phone], has been identified by the patient as the family member/significant other with whom the patient will be residing, and identified as the person(s) who will aid the patient in the event of a mental health crisis (suicidal ideations/suicide attempt).  With written consent from the patient, the family member/significant other has been provided the following suicide prevention education, prior to the and/or following the discharge of the patient.  The suicide prevention education provided includes the following:  Suicide risk factors  Suicide prevention and interventions  National Suicide Hotline telephone number  [facility] assessment telephone number  [facility] Emergency Assistance 911  [facility] and/or Residential Mobile Crisis Unit telephone number  Request made of family/significant other to:  Remove weapons (e.g., guns, rifles, knives), all items previously/currently identified as safety concern.  Kim Massey has hunting rifles but they are locked in gun safe.  Remove drugs/medications (over-the-counter, prescriptions, illicit drugs), all items previously/currently identified as a safety concern.  The family member/significant other verbalizes understanding of the suicide prevention education information provided.  The family member/significant other agrees to remove the items of safety concern listed above.  Kim Massey reports pt has significant pain issues, on lots of pain meds.  Pt having an argument with her brother and sister over a horse that they sold and pt was upset about.  Kim Massey also reports they also had an argument prior to this.  At the end of the call, Kim Massey reported he found a note, denied it was a suicide note, but said it was talking about the argument and the situation with the siblings.  We discussed the  importance of keeping track of medication moving forward to make sure pt is not taking too much.   Kim Frederick, LCSW 07/05/2018, 1:58 PM

## 2018-07-05 NOTE — Progress Notes (Signed)
Recreation Therapy Notes  Date: 10.4.19 Time: 0930 Location: [address] Dayroom  Group Topic: Stress Management  Goal Area(s) Addresses:  Patient will verbalize importance of using healthy stress management.  Patient will identify positive emotions associated with healthy stress management.   Intervention: Stress Management  Activity :  Patients were introduced to ways in which they can relieve stress.   Education:  Stress Management, Discharge Planning.   Education Outcome: Acknowledges edcuation/In group clarification offered/Needs additional education  Clinical Observations/Feedback: Pt did not attend group.     Caroll Rancher, LRT/CTRS         Caroll Rancher A 07/05/2018 11:52 AM

## 2018-07-06 LAB — CBC WITH DIFFERENTIAL/PLATELET
Band Neutrophils: 0 %
Basophils Absolute: 0 10*3/uL (ref 0.0–0.1)
Basophils Relative: 0 %
Blasts: 0 %
EOS PCT: 5 %
Eosinophils Absolute: 0.3 10*3/uL (ref 0.0–0.7)
HCT: 34.9 % — ABNORMAL LOW (ref 36.0–46.0)
Hemoglobin: 11.8 g/dL — ABNORMAL LOW (ref 12.0–15.0)
LYMPHS ABS: 2.4 10*3/uL (ref 0.7–4.0)
LYMPHS PCT: 40 %
MCH: 32.9 pg (ref 26.0–34.0)
MCHC: 33.8 g/dL (ref 30.0–36.0)
MCV: 97.2 fL (ref 78.0–100.0)
MONOS PCT: 8 %
Metamyelocytes Relative: 4 %
Monocytes Absolute: 0.5 10*3/uL (ref 0.1–1.0)
Myelocytes: 3 %
NEUTROS ABS: 2.7 10*3/uL (ref 1.7–7.7)
NEUTROS PCT: 40 %
NRBC: 0 /100{WBCs}
OTHER: 0 %
PLATELETS: 327 10*3/uL (ref 150–400)
Promyelocytes Relative: 0 %
RBC: 3.59 MIL/uL — ABNORMAL LOW (ref 3.87–5.11)
RDW: 12.3 % (ref 11.5–15.5)
WBC: 5.9 10*3/uL (ref 4.0–10.5)

## 2018-07-06 LAB — CULTURE, BLOOD (ROUTINE X 2)
CULTURE: NO GROWTH
SPECIAL REQUESTS: ADEQUATE

## 2018-07-06 LAB — C DIFFICILE QUICK SCREEN W PCR REFLEX
C DIFFICILE (CDIFF) TOXIN: NEGATIVE
C Diff antigen: NEGATIVE
C Diff interpretation: NOT DETECTED

## 2018-07-06 LAB — HEPATIC FUNCTION PANEL
ALT: 102 U/L — ABNORMAL HIGH (ref 0–44)
AST: 99 U/L — AB (ref 15–41)
Albumin: 3 g/dL — ABNORMAL LOW (ref 3.5–5.0)
Alkaline Phosphatase: 52 U/L (ref 38–126)
Bilirubin, Direct: 0.1 mg/dL (ref 0.0–0.2)
Indirect Bilirubin: 0.4 mg/dL (ref 0.3–0.9)
TOTAL PROTEIN: 6.7 g/dL (ref 6.5–8.1)
Total Bilirubin: 0.5 mg/dL (ref 0.3–1.2)

## 2018-07-06 LAB — TSH: TSH: 3.145 u[IU]/mL (ref 0.350–4.500)

## 2018-07-06 LAB — CK: Total CK: 1611 U/L — ABNORMAL HIGH (ref 38–234)

## 2018-07-06 NOTE — [facility] Group Notes (Signed)
LCSW Group Therapy Note  07/06/2018   10:00-11:00am   Type of Therapy and Topic:  Group Therapy: Anger Cues and Responses  Participation Level:  Active   Description of Group:   In this group, patients learned how to recognize the physical, cognitive, emotional, and behavioral responses they have to anger-provoking situations.  They identified a recent time they became angry and how they reacted.  They analyzed how their reaction was possibly beneficial and how it was possibly unhelpful.  The group discussed a variety of healthier coping skills that could help with such a situation in the future.  Deep breathing was practiced briefly.  Therapeutic Goals: 1. Patients will remember their last incident of anger and how they felt emotionally and physically, what their thoughts were at the time, and how they behaved. 2. Patients will identify how their behavior at that time worked for them, as well as how it worked against them. 3. Patients will explore possible new behaviors to use in future anger situations. 4. Patients will learn that anger itself is normal and cannot be eliminated, and that healthier reactions can assist with resolving conflict rather than worsening situations.  Summary of Patient Progress:  The patient shared that her most recent time of anger was last Sunday and said her reaction was being annoyed, saying she is never really angry.  She listened attentively and participated when called on directly.  Therapeutic Modalities:   Cognitive Behavioral Therapy  Kim Massey

## 2018-07-06 NOTE — Progress Notes (Signed)
Adult Psychoeducational Group Note  Date:  07/06/2018 Time:  3:45 AM  Group Topic/Focus:  Wrap-Up Group:   The focus of this group is to help patients review their daily goal of treatment and discuss progress on daily workbooks.  Participation Level:  Active  Participation Quality:  Appropriate  Affect:  Appropriate  Cognitive:  Appropriate  Insight: Appropriate and Good  Engagement in Group:  Engaged  Modes of Intervention:  Discussion  Additional Comments:  Her day was a 10. The one positive thing that happen to her was she talked to the doctor. This was a good communication she needed. The doctor helped her to understand what going on with her bodde due to the medication. She laugh today. She havent ladugh so much in a long time. She has really enjoyed the patients to help her.  Charna Busman Long 07/06/2018, 3:45 AM

## 2018-07-06 NOTE — Progress Notes (Signed)
D. Pt presents as calm and cooperative, smiling during interactions - observed interacting well with peers in the dayroom. Pt reports having slept well last night- reports feeling like she's getting a yeast infection from antibiotics and is requesting diflucan. Per pt's self inventory, pt rates her depression, hopelessness and anxiety all 0's Pt currently denies SI/HI and AV hallucinations..  A. Labs and vitals monitored. Pt compliant medications. Pt supported emotionally and encouraged to express concerns and ask questions.   R. Pt remains safe with 15 minute checks. Will continue POC.

## 2018-07-06 NOTE — Progress Notes (Addendum)
[facility] MD Progress Note  07/06/2018 1:48 PM BLANCHIE ZELEZNIK  MRN:  [id number] Subjective:  Patient reports she is feeling "OK", and currently minimizes depression, denies suicidal ideations. She denies medication side effects. Objective : I have reviewed chart notes and have met with patient . 52 year old female, S/P overdose (hydrocodone, clonazepam, cyclobenzaprine) on 9/29. Overdose was severe and required ICU admission, intubation. Patient states overdose was accidental, not intentional , and categorically denies any suicidal intent. States she was not feeling sad or depressed prior to above incident, and had not been having any suicidal or self injurious ideations. States she took medications in an effort to alleviate pain. Denies pattern of BZD abuse ( states she was taking Clonazepam 1-2 times a week only) or opiate abuse ( reports she had just recently been prescribed opiates for back pain).  Patient presents alert, attentive, pleasant on approach, minimizes depression and states her mood is " all right". Denies suicidal ideations. No disruptive or agitated behaviors on unit . Of note, patient reports persistent diarrhea- as noted she reports a past history of C Diff. I have reviewed with hospitalist ( see note) and today spoke with ID consultant . Based on history and persistence of diarrhea, recommendation is to order C diff PCR and observe enteric precautions pending result .  Patient denies recent overdose was suicidal in intent, describes it as accidental , and minimizes/denies  depression or neuro-vegetative symptoms. *CSW has obtained collateral information from SO, who reported patient was taking oral analgesics, had recent arguments with family about sale of a horse, and had left a note, which was not suicide note, but described argument with siblings . Labs reviewed - CK remains elevated but significantly improved/trending down compared to prior. Hgb improved to 11.8, AST trending down  as well .   Principal Problem: S/ P Overdose  Diagnosis:   Patient Active Problem List   Diagnosis Date Noted  . Bipolar affective disorder, depressed, severe ([place]) [F31.4] 07/04/2018  . Rhabdomyolysis [M62.82] 07/02/2018  . Fall at home, initial encounter [W19.Merril Abbe, X83.382] 07/02/2018  . Transaminitis [R74.0] 07/02/2018  . Pneumonia of right upper lobe due to infectious organism ([place]) [J18.1]   . Overdose [T50.901A] 06/30/2018  . STYE [N05.397] 11/04/2010  . URI [J06.9] 11/04/2010  . BICEPS TENDINITIS [M75.20] 11/04/2010   Total Time spent with patient: 20 minutes  Past Psychiatric History:   Past Medical History:  Past Medical History:  Diagnosis Date  . Anxiety   . Arthritis    "lower back; upper neck" (07/03/2018)  . Aspiration pneumonia due to vomit ([place]) 06/30/2018   Archie Endo 06/30/2018  . Bipolar disorder ([place])   . Chronic lower back pain   . 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 Depression   . Drug overdose, intentional ([place]) 06/30/2018   suspected; empty pill bottles at the bedside; empty bottles of clonazepam and Flexeril; Utox Positive for Opiates, Amphetamines and Benzodiazepines/notes 06/30/2018  . GERD (gastroesophageal reflux disease)   . History of hiatal hernia   . History of kidney stones   . Hypertension    Archie Endo 06/30/2018  . Pneumonia    "twice" (07/03/2018)    Past Surgical History:  Procedure Laterality Date  . ABDOMINAL HYSTERECTOMY    . APPENDECTOMY    . CARPAL TUNNEL RELEASE Bilateral   . CHOLECYSTECTOMY OPEN    . FACET JOINT INJECTION  07/02/2018   BILATERAL FACET/PARAVERT JOINT INJECTION-L4-5, L5-S1 Archie Endo 07/02/2018  . HERNIA REPAIR    .  NISSEN FUNDOPLICATION    . TONSILLECTOMY    . TOTAL SHOULDER REPLACEMENT Left   . WRIST FRACTURE SURGERY Bilateral    Family History: History reviewed. No pertinent family history. Family Psychiatric  History: Social History:  Social History   Substance and  Sexual Activity  Alcohol Use Not Currently     Social History   Substance and Sexual Activity  Drug Use Yes  . Types: Barbituates, Benzodiazepines, Hydrocodone    Social History   Socioeconomic History  . Marital status: Divorced    Spouse name: Not on file  . Number of children: Not on file  . Years of education: Not on file  . Highest education level: Not on file  Occupational History  . Not on file  Social Needs  . Financial resource strain: Not on file  . Food insecurity:    Worry: Not on file    Inability: Not on file  . Transportation needs:    Medical: Not on file    Non-medical: Not on file  Tobacco Use  . Smoking status: Never Smoker  . Smokeless tobacco: Never Used  Substance and Sexual Activity  . Alcohol use: Not Currently  . Drug use: Yes    Types: Barbituates, Benzodiazepines, Hydrocodone  . Sexual activity: Yes  Lifestyle  . Physical activity:    Days per week: Not on file    Minutes per session: Not on file  . Stress: Not on file  Relationships  . Social connections:    Talks on phone: Not on file    Gets together: Not on file    Attends religious service: Not on file    Active member of club or organization: Not on file    Attends meetings of clubs or organizations: Not on file    Relationship status: Not on file  Other Topics Concern  . Not on file  Social History Narrative  . Not on file   Additional Social History:   Sleep: Fair  Appetite:  Good  Current Medications: Current Facility-Administered Medications  Medication Dose Route Frequency Provider Last Rate Last Dose  . acetaminophen (TYLENOL) tablet 650 mg  650 mg Oral Q6H PRN Hampton Abbot, MD   650 mg at 07/06/18 1159  . alum & mag hydroxide-simeth (MAALOX/MYLANTA) 200-200-20 MG/5ML suspension 30 mL  30 mL Oral Q4H PRN Hampton Abbot, MD      . DULoxetine (CYMBALTA) DR capsule 40 mg  40 mg Oral Daily Princessa Lesmeister, Myer Peer, MD   40 mg at 07/06/18 0743  . hydrOXYzine  (ATARAX/VISTARIL) tablet 25 mg  25 mg Oral Q6H PRN Lindon Romp A, NP   25 mg at 07/05/18 2252  . magnesium hydroxide (MILK OF MAGNESIA) suspension 30 mL  30 mL Oral Daily PRN Hampton Abbot, MD      . traZODone (DESYREL) tablet 50 mg  50 mg Oral QHS PRN Rozetta Nunnery, NP   50 mg at 07/04/18 2107    Lab Results:  Results for orders placed or performed during the hospital encounter of 07/04/18 (from the past 48 hour(s))  CK     Status: Abnormal   Collection Time: 07/06/18  6:20 AM  Result Value Ref Range   Total CK 1,611 (H) 38 - 234 U/L    Comment: Performed at [facility], [place] [address] [place], [place] [address]  Hepatic function panel     Status: Abnormal   Collection Time: 07/06/18  6:20 AM  Result Value Ref Range  Total Protein 6.7 6.5 - 8.1 g/dL   Albumin 3.0 (L) 3.5 - 5.0 g/dL   AST 99 (H) 15 - 41 U/L   ALT 102 (H) 0 - 44 U/L   Alkaline Phosphatase 52 38 - 126 U/L   Total Bilirubin 0.5 0.3 - 1.2 mg/dL   Bilirubin, Direct 0.1 0.0 - 0.2 mg/dL   Indirect Bilirubin 0.4 0.3 - 0.9 mg/dL    Comment: Performed at [facility], [place] [address] [place], [place] [address]  CBC with Differential/Platelet     Status: Abnormal   Collection Time: 07/06/18  6:20 AM  Result Value Ref Range   WBC 5.9 4.0 - 10.5 K/uL   RBC 3.59 (L) 3.87 - 5.11 MIL/uL   Hemoglobin 11.8 (L) 12.0 - 15.0 g/dL   HCT 34.9 (L) 36.0 - 46.0 %   MCV 97.2 78.0 - 100.0 fL   MCH 32.9 26.0 - 34.0 pg   MCHC 33.8 30.0 - 36.0 g/dL   RDW 12.3 11.5 - 15.5 %   Platelets 327 150 - 400 K/uL   Neutrophils Relative % 40 %   Lymphocytes Relative 40 %   Monocytes Relative 8 %   Eosinophils Relative 5 %   Basophils Relative 0 %   Band Neutrophils 0 %   Metamyelocytes Relative 4 %   Myelocytes 3 %   Promyelocytes Relative 0 %   Blasts 0 %   nRBC 0 0 /100 WBC   Other 0 %   Neutro Abs 2.7 1.7 - 7.7 K/uL   Lymphs Abs 2.4 0.7 - 4.0 K/uL   Monocytes Absolute 0.5 0.1 - 1.0 K/uL    Eosinophils Absolute 0.3 0.0 - 0.7 K/uL   Basophils Absolute 0.0 0.0 - 0.1 K/uL   WBC Morphology MILD LEFT SHIFT (1-5% METAS, OCC MYELO, OCC BANDS)     Comment: Performed at [facility], [place] [address] [place], [place] [address]  TSH     Status: None   Collection Time: 07/06/18  6:20 AM  Result Value Ref Range   TSH 3.145 0.350 - 4.500 uIU/mL    Comment: Performed by a 3rd Generation assay with a functional sensitivity of <=0.01 uIU/mL. Performed at [facility], [place] [address] [place], [place] [address]     Blood Alcohol level:  Lab Results  Component Value Date   ETH <10 06/30/2018   ETH (H) 08/21/2009    88        LOWEST DETECTABLE LIMIT FOR SERUM ALCOHOL IS 5 mg/dL FOR MEDICAL PURPOSES ONLY    Metabolic Disorder Labs: No results found for: HGBA1C, MPG No results found for: PROLACTIN No results found for: CHOL, TRIG, HDL, CHOLHDL, VLDL, LDLCALC  Physical Findings: AIMS: Facial and Oral Movements Muscles of Facial Expression: None, normal Lips and Perioral Area: None, normal Jaw: None, normal Tongue: None, normal,Extremity Movements Upper (arms, wrists, hands, fingers): None, normal Lower (legs, knees, ankles, toes): None, normal, Trunk Movements Neck, shoulders, hips: None, normal, Overall Severity Severity of abnormal movements (highest score from questions above): None, normal Incapacitation due to abnormal movements: None, normal Patient's awareness of abnormal movements (rate only patient's report): No Awareness, Dental Status Current problems with teeth and/or dentures?: No Does patient usually wear dentures?: No  CIWA:  CIWA-Ar Total: 2 COWS:  COWS Total Score: 4  Musculoskeletal: Strength & Muscle Tone: within normal limits Gait & Station: normal Patient leans: N/A  Psychiatric Specialty Exam: Physical Exam  ROS denies chest pain,  no shortness of breath, no vomiting, (+) diarrhea, no fever or chills   Blood  pressure (!) 116/92, pulse (!) 105, temperature 98.5 F (36.9 C), temperature source Oral, resp. rate 20, height 5' 2"  (1.575 m), weight 66.2 kg, SpO2 97 %.Body mass index is 26.7 kg/m.  General Appearance: Well Groomed  Eye Contact:  Good  Speech:  Normal Rate  Volume:  Normal  Mood:  reports mood is "OK", minimizes depression at this time  Affect:  Appropriate, vaguely anxious    Thought Process:  Linear and Descriptions of Associations: Intact  Orientation:  Full (Time, Place, and Person)  Thought Content:  no hallucinations, no delusions, not internally preoccupied   Suicidal Thoughts:  No- denies suicidal or self injurious ideations, denies homicidal or violent ideations  Homicidal Thoughts:  No  Memory:  recent and remote grossly intact   Judgement:  Fair, improving  Insight:  Fair  Psychomotor Activity:  Normal  Concentration:  Concentration: Good and Attention Span: Good  Recall:  Good  Fund of Knowledge:  Good  Language:  Good  Akathisia:  Negative  Handed:  Right  AIMS (if indicated):     Assets:  Desire for Improvement Resilience  ADL's:  Intact  Cognition:  WNL  Sleep:  Number of Hours: 5    Assessment - 52 year old female, S/P overdose (hydrocodone, clonazepam, cyclobenzaprine) on 9/29. Overdose was severe and required ICU admission, intubation. Patient states overdose was accidental, not intentional , and categorically denies any suicidal intent. States she was not feeling sad or depressed prior to above incident, and had not been having any suicidal or self injurious ideations. States she took medications in an effort to alleviate pain. Denies pattern of BZD abuse ( states she was taking Clonazepam 1-2 times a week only) or opiate abuse ( reports she had just recently been prescribed opiates for back pain).  Patient currently minimizes depression or neuro-vegetative symptoms , denies SI.  Denies medication side effects. Reports tolerating Cymbalta well, we discussed  titrating to 60 mgrs QDAY but states prefers current dose at this time . Reports ongoing diarrhea today  ( now off oral antibiotics ) and as per ID consultant , C Diff testing requested  Treatment Plan Summary: Daily contact with patient to assess and evaluate symptoms and progress in treatment, Medication management, Plan inpatient treatment  and medications as below Encourage group and milieu participation  Continue Cymbalta 40 mgrs QDAY for depression, anxiety Continue Vistaril 25 mgrs Q 6 hours PRN for anxiety Continue Trazodone 50 mgrs QHS PRN for insomnia As recommended by ID consultant , enteric precautions  and request C Diff PCR testing  Jenne Campus, MD 07/06/2018, 1:48 PM

## 2018-07-06 NOTE — Progress Notes (Addendum)
Patient moved to Rm 404 for enteric precautions- C-diff results pending.

## 2018-07-06 NOTE — BHH Counselor (Signed)
Clinical Social Work Note  Contact was made by phone with pt's fiance, who stated that he does not feel that pt was attempting to hurt herself but was trying to cope with her pain.  He stated he will come get her as soon as he is contacted tomorrow to do so, and he feels good about this plan.  He stated he would not say this if he thought there was any chance she could potentially hurt herself.  Ambrose Mantle, LCSW 07/06/2018, 5:04 PM

## 2018-07-06 NOTE — B[facility] Group Notes (Signed)
[facility] Group Notes:  (Nursing)  Date:  07/06/2018  Time:130 PM Type of Therapy:  Nurse Education  Participation Level:  Active  Participation Quality:  Appropriate and Attentive  Affect:  Appropriate  Cognitive:  Alert and Appropriate  Insight:  Good  Engagement in Group:  Engaged  Modes of Intervention:  Discussion and Education  Summary of Progress/Problems: Nurse led group played non competitive learning/communication board game that fosters listening skills as well as self expression  Shela Nevin 07/06/2018, 7:41 PM

## 2018-07-06 NOTE — BHH Counselor (Signed)
Clinical Social Work Note  CSW attempted to contact pt's fiance Orlie Dakin [phone] at doctor's request, left a message on personal voicemail.  Ambrose Mantle, LCSW 07/06/2018, 3:56 PM

## 2018-07-07 MED ORDER — TRAZODONE HCL 50 MG PO TABS: 50.0000 mg | ORAL_TABLET | Freq: Every evening | ORAL | 0 refills | Status: AC | PRN

## 2018-07-07 MED ORDER — FLUCONAZOLE 100 MG PO TABS
150.0000 mg | ORAL_TABLET | Freq: Once | ORAL | Status: AC
  Administered 2018-07-07: 150 mg via ORAL
  Filled 2018-07-07: qty 1.5

## 2018-07-07 MED ORDER — HYDROXYZINE HCL 25 MG PO TABS: 25.0000 mg | ORAL_TABLET | Freq: Four times a day (QID) | ORAL | 0 refills | Status: AC | PRN

## 2018-07-07 MED ORDER — DULOXETINE HCL 40 MG PO CPEP: 40.0000 mg | ORAL_CAPSULE | Freq: Every day | ORAL | 0 refills | Status: AC

## 2018-07-07 NOTE — Progress Notes (Signed)
  [facility] Adult Case Management Discharge Plan :  Will you be returning to the same living situation after discharge:  Yes,  with fiance At discharge, do you have transportation home?: Yes,  fiance Do you have the ability to pay for your medications: Yes,  denies problems  Release of information consent forms completed and in the chart;  Patient's signature needed at discharge.  Patient to Follow up at: Follow-up Information    Patient declines follow-up referrals. Follow up.           Next level of care provider has access to [facility] Link:no  Safety Planning and Suicide Prevention discussed: Yes,  with fiance     Has patient been referred to the Quitline?: N/A patient is not a smoker  Patient has been referred for addiction treatment: N/A  Lynnell Chad, LCSW 07/07/2018, 1:01 PM

## 2018-07-07 NOTE — B[facility] Group Notes (Signed)
[facility] LCSW Group Therapy Note  07/07/2018  10:00-11:00AM  Type of Therapy and Topic:  Group Therapy:  Adding Supports Including Being Your Own Support  Participation Level:  Active   Description of Group:  Patients in this group were introduced to the concept that additional supports including self-support are an essential part of recovery.  A song entitled "I Need Help!" was played and a group discussion was held in reaction to the idea of needing to add supports.  A song entitled "My Own Hero" was played and a group discussion ensued in which patients stated they could relate to the song and it inspired them to realize they have be willing to help themselves in order to succeed, because other people cannot achieve sobriety or stability for them.  We discussed adding a variety of healthy supports to address the various needs in their lives.  A song was played called "I Know Where I've Been" toward the end of group and used to conduct an inspirational wrap-up to group of remembering how far they have already come in their journey.  Therapeutic Goals: 1)  demonstrate the importance of being a part of one's own support system 2)  discuss reasons people in one's life may eventually be unable to be continually supportive  3)  identify the patient's current support system and   4)  elicit commitments to add healthy supports and to become more conscious of being self-supportive   Summary of Patient Progress:  The patient expressed that her fiance, nieces and pastor are healthy supports while her brother and sister are unhealthy for her.  She demonstrated greatly improved insight throughout group.   Therapeutic Modalities:   Motivational Interviewing Activity  Lynnell Chad

## 2018-07-07 NOTE — [facility] Suicide Risk Assessment (Signed)
[facility] Discharge Suicide Risk Assessment   Principal Problem:  S/P overdose Discharge Diagnoses:  Patient Active Problem List   Diagnosis Date Noted  . Bipolar affective disorder, depressed, severe (HCC) [F31.4] 07/04/2018  . Rhabdomyolysis [M62.82] 07/02/2018  . Fall at home, initial encounter [W19.Lorne Skeens, Y92.009] 07/02/2018  . Transaminitis [R74.0] 07/02/2018  . Pneumonia of right upper lobe due to infectious organism (HCC) [J18.1]   . Overdose [T50.901A] 06/30/2018  . STYE [H00.019] 11/04/2010  . URI [J06.9] 11/04/2010  . BICEPS TENDINITIS [M75.20] 11/04/2010    Total Time spent with patient: 30 minutes  Musculoskeletal: Strength & Muscle Tone: within normal limits Gait & Station: normal Patient leans: N/A  Psychiatric Specialty Exam: ROS denies headache, no chest pain, no dyspnea at room air, reports improving diarrhea, no fever or chills   Blood pressure (!) 116/92, pulse (!) 105, temperature 98.5 F (36.9 C), temperature source Oral, resp. rate 20, height 5\' 2"  (1.575 m), weight 66.2 kg, SpO2 97 %.Body mass index is 26.7 kg/m.  General Appearance: improving grooming   Eye Contact::  Good  Speech:  Normal Rate409  Volume:  Normal  Mood:  denies feeling depressed, states mood is " fine", presents euthymic   Affect:  Full Range  Thought Process:  Linear and Descriptions of Associations: Intact  Orientation:  Full (Time, Place, and Person)  Thought Content:  no hallucinations, no delusions, not internally preoccupied   Suicidal Thoughts:  No no suicidal or self injurious ideations, no self injurious or violent ideations   Homicidal Thoughts:  No  Memory:  recent and remote grossly intact   Judgement:  Other:  improving   Insight:  fair- improving   Psychomotor Activity:  Normal  Concentration:  Good  Recall:  Good  Fund of Knowledge:Good  Language: Good  Akathisia:  Negative  Handed:  Right  AIMS (if indicated):     Assets:  Desire for Improvement Resilience   Sleep:  Number of Hours: 6.75  Cognition: WNL  ADL's:  Intact   Mental Status Per Nursing Assessment::   On Admission:  NA  Demographic Factors:  52 year old female , divorced, lives with fiance, has one adult daughter   Loss Factors: Patient reports she had a recent fall with some increased knee pain. Recent argument with siblings regarding sale of a horse   Historical Factors: One prior psychiatric admission 10 years ago for depression in the context of separation/diovrce, denies history of suicide attempts   Risk Reduction Factors:   Responsible for children under 42 years of age, Sense of responsibility to family, Living with another person, especially a relative, Positive social support and Positive coping skills or problem solving skills  Continued Clinical Symptoms:  Patient is alert, attentive, well related, pleasant, calm, mood is described as " fine", " normal", and presents euthymic, with a full range, bright affect, no thought disorder , no suicidal or self injurious ideations , no homicidal or violent ideations, no hallucinations, no delusions, future oriented. Behavior on unit in good control, pleasant on approach. With her express consent I have spoken with her SO, Dio, who has visited her on unit. He corroborates patient presents stable and at baseline and is in agreement with discharge . Tolerating Cymbalta well, denies side effects.  Of note, reports improving diarrhea, and 10/5 stool CDiff work up is negative .  Patient states that her overdose was accidental , and states she intends not to resume BZDs or Opiate analgesics after discharge .   Cognitive  Features That Contribute To Risk:  No gross cognitive deficits noted upon discharge. Is alert , attentive, and oriented x 3   Suicide Risk:  Mild:  Suicidal ideation of limited frequency, intensity, duration, and specificity.  There are no identifiable plans, no associated intent, mild dysphoria and related  symptoms, good self-control (both objective and subjective assessment), few other risk factors, and identifiable protective factors, including available and accessible social support.  Follow-up Information    Patient declines follow-up referrals. Follow up.           Plan Of Care/Follow-up recommendations:  Activity:  as tolerated  Diet:  regular Tests:  NA Other:  See below   Patient is expressing readiness for discharge and is leaving unit in good spirits Plans to return home, SO will pick her up later today  Plans to continue following up with her PCP for medication management  Craige Cotta, MD 07/07/2018, 10:38 AM

## 2018-07-07 NOTE — Progress Notes (Signed)
Patient has been up and active on the unit, attended group this evening and participated. She is hopeful to discharge on tomorrow. She was happy her fiance visited tonight. Patient currently denies si/hi/auditory or visual hallucination.Sipport and encouragement offered, safety maintained on unit, will continue to monitor.

## 2018-07-07 NOTE — Discharge Summary (Addendum)
Physician Discharge Summary Note  Patient:  Kim Massey is an 52 y.o., female MRN:  [id number] DOB:  1966/03/08 Patient phone:  [phone] (home)  Patient address:   [address] [place] [address],  Total Time spent with patient: 20 minutes  Date of Admission:  07/04/2018 Date of Discharge: 07/10/18  Reason for Admission:  Worsening depression with overdose attempt  Principal Problem: Bipolar affective disorder, depressed, severe [facility]) Discharge Diagnoses: Patient Active Problem List   Diagnosis Date Noted  . Bipolar affective disorder, depressed, severe (HCC) [F31.4] 07/04/2018  . Rhabdomyolysis [M62.82] 07/02/2018  . Fall at home, initial encounter [W19.Lorne Skeens, Y92.009] 07/02/2018  . Transaminitis [R74.0] 07/02/2018  . Pneumonia of right upper lobe due to infectious organism (HCC) [J18.1]   . Overdose [T50.901A] 06/30/2018  . STYE [H00.019] 11/04/2010  . URI [J06.9] 11/04/2010  . BICEPS TENDINITIS [M75.20] 11/04/2010    Past Psychiatric History: one prior psychiatric admission 10 years ago for depression in the context of divorce/separation. Denies other episodes of severe depression and denies history of mania. Denies history of prior suicidal attempts, denies history of self cutting or self injurious behaviors, denies history of psychosis , denies panic or agoraphobia, denies history of genrealized or excessive anxiety, denies history of violence  Past Medical History:  Past Medical History:  Diagnosis Date  . Anxiety   . Arthritis    "lower back; upper neck" (07/03/2018)  . Aspiration pneumonia due to vomit (HCC) 06/30/2018   [name] 06/30/2018  . Bipolar disorder (HCC)   . Chronic lower back pain   . CVID (common variable immunodeficiency) (HCC)    dx'd 2009/notes 06/30/2018  . Depression   . Drug overdose, intentional (HCC) 06/30/2018   suspected; empty pill bottles at the bedside; empty bottles of clonazepam and Flexeril; Utox Positive for Opiates,  Amphetamines and Benzodiazepines/notes 06/30/2018  . GERD (gastroesophageal reflux disease)   . History of hiatal hernia   . History of kidney stones   . Hypertension    [name] 06/30/2018  . Pneumonia    "twice" (07/03/2018)    Past Surgical History:  Procedure Laterality Date  . ABDOMINAL HYSTERECTOMY    . APPENDECTOMY    . CARPAL TUNNEL RELEASE Bilateral   . CHOLECYSTECTOMY OPEN    . FACET JOINT INJECTION  07/02/2018   BILATERAL FACET/PARAVERT JOINT INJECTION-L4-5, L5-S1 [name] 07/02/2018  . HERNIA REPAIR    . NISSEN FUNDOPLICATION    . TONSILLECTOMY    . TOTAL SHOULDER REPLACEMENT Left   . WRIST FRACTURE SURGERY Bilateral    Family History: History reviewed. No pertinent family history. Family Psychiatric  History: brother attempted suicide, a nephew died from accidental opiate overdose, denies history of alcohol use disorder in family Social History:  Social History   Substance and Sexual Activity  Alcohol Use Not Currently     Social History   Substance and Sexual Activity  Drug Use Yes  . Types: Barbituates, Benzodiazepines, Hydrocodone    Social History   Socioeconomic History  . Marital status: Divorced    Spouse name: Not on file  . Number of children: Not on file  . Years of education: Not on file  . Highest education level: Not on file  Occupational History  . Not on file  Social Needs  . Financial resource strain: Not on file  . Food insecurity:    Worry: Not on file    Inability: Not on file  . Transportation needs:    Medical: Not on file  Non-medical: Not on file  Tobacco Use  . Smoking status: Never Smoker  . Smokeless tobacco: Never Used  Substance and Sexual Activity  . Alcohol use: Not Currently  . Drug use: Yes    Types: Barbituates, Benzodiazepines, Hydrocodone  . Sexual activity: Yes  Lifestyle  . Physical activity:    Days per week: Not on file    Minutes per session: Not on file  . Stress: Not on file  Relationships  . Social  connections:    Talks on phone: Not on file    Gets together: Not on file    Attends religious service: Not on file    Active member of club or organization: Not on file    Attends meetings of clubs or organizations: Not on file    Relationship status: Not on file  Other Topics Concern  . Not on file  Social History Narrative  . Not on file    Hospital Course:   07/05/18 [facility] MD Assessment:52 year old female, presented to ED via EMS after being found cyanotic and unresponsive by a roommate, with empty medication bottles at side ( cyclobenzaprine and clonazepam). Overdose was severe and required intubation and management in ICU setting.   Patient states " it was not a suicide attempt, it was an accident ". States that prior to episode had in fact been out to dinner with friends and had " a great time". States " I was in a good mood " and states she was not feeling depressed or suicidal .Reports she took the medications because she had earlier fallen while trying to hang a picture at home, resulting in some  increased back and knee pain.  She denies any significant neuro-vegetative symptoms of depression. Denies anhedonia or changes in sleep, appetite, energy level, and categorically denies any suicidal ideations. Also denies any psychotic symptoms.  Patient remained on the [facility] unit for 6 days. The patient stabilized on medication and therapy. Patient was discharged on Cymbalta Cymbalta 40 mg Daily, Vistaril 25 mg Q6H PRN, Trazodone 50 mg QHS PRN. Patient has shown improvement with improved mood, affect, sleep, appetite, and interaction. Patient has attended group and participated. Patient has been seen in the day room interacting with peers and staff appropriately. Patient denies any SI/HI/AVH and contracts for safety. Patient declines follow upappointment. Patient is provided with prescriptions for their medications upon discharge.    Physical Findings: AIMS: Facial and Oral Movements Muscles  of Facial Expression: None, normal Lips and Perioral Area: None, normal Jaw: None, normal Tongue: None, normal,Extremity Movements Upper (arms, wrists, hands, fingers): None, normal Lower (legs, knees, ankles, toes): None, normal, Trunk Movements Neck, shoulders, hips: None, normal, Overall Severity Severity of abnormal movements (highest score from questions above): None, normal Incapacitation due to abnormal movements: None, normal Patient's awareness of abnormal movements (rate only patient's report): No Awareness, Dental Status Current problems with teeth and/or dentures?: No Does patient usually wear dentures?: No  CIWA:  CIWA-Ar Total: 2 COWS:  COWS Total Score: 4  Musculoskeletal: Strength & Muscle Tone: within normal limits Gait & Station: normal Patient leans: Right  Psychiatric Specialty Exam: Physical Exam  Nursing note and vitals reviewed. Constitutional: She is oriented to person, place, and time. She appears well-developed and well-nourished.  Respiratory: Effort normal.  Musculoskeletal: Normal range of motion.  Neurological: She is alert and oriented to person, place, and time.  Skin: Skin is warm.    Review of Systems  Constitutional: Negative.   HENT: Negative.  Eyes: Negative.   Respiratory: Negative.   Cardiovascular: Negative.   Gastrointestinal: Negative.   Genitourinary: Negative.   Musculoskeletal: Negative.   Skin: Negative.   Neurological: Negative.   Endo/Heme/Allergies: Negative.   Psychiatric/Behavioral: Negative.     Blood pressure (!) 116/92, pulse (!) 105, temperature 98.5 F (36.9 C), temperature source Oral, resp. rate 20, height 5\' 2"  (1.575 m), weight 66.2 kg, SpO2 97 %.Body mass index is 26.7 kg/m.  General Appearance: Casual  Eye Contact:  Good  Speech:  Clear and Coherent and Normal Rate  Volume:  Normal  Mood:  Euthymic  Affect:  Appropriate  Thought Process:  Goal Directed and Descriptions of Associations: Intact   Orientation:  Full (Time, Place, and Person)  Thought Content:  WDL  Suicidal Thoughts:  No  Homicidal Thoughts:  No  Memory:  Immediate;   Good Recent;   Good Remote;   Good  Judgement:  Fair  Insight:  Fair  Psychomotor Activity:  Normal  Concentration:  Concentration: Good and Attention Span: Good  Recall:  Good  Fund of Knowledge:  Good  Language:  Good  Akathisia:  No  Handed:  Right  AIMS (if indicated):     Assets:  Communication Skills Desire for Improvement Financial Resources/Insurance Housing Physical Health Social Support Transportation  ADL's:  Intact  Cognition:  WNL  Sleep:  Number of Hours: 6.75        Has this patient used any form of tobacco in the last 30 days? (Cigarettes, Smokeless Tobacco, Cigars, and/or Pipes) Yes, No  Blood Alcohol level:  Lab Results  Component Value Date   ETH <10 06/30/2018   ETH (H) 08/21/2009    88        LOWEST DETECTABLE LIMIT FOR SERUM ALCOHOL IS 5 mg/dL FOR MEDICAL PURPOSES ONLY    Metabolic Disorder Labs:  No results found for: HGBA1C, MPG No results found for: PROLACTIN No results found for: CHOL, TRIG, HDL, CHOLHDL, VLDL, LDLCALC  See Psychiatric Specialty Exam and Suicide Risk Assessment completed by Attending Physician prior to discharge.  Discharge destination:  Home  Is patient on multiple antipsychotic therapies at discharge:  No   Has Patient had three or more failed trials of antipsychotic monotherapy by history:  No  Recommended Plan for Multiple Antipsychotic Therapies: NA   Allergies as of 07/07/2018      Reactions   Nsaids Other (See Comments)   Due to crohn's disease   Propoxyphene Nausea And Vomiting   Aspirin Other (See Comments)   bleeding      Medication List    STOP taking these medications   amoxicillin-clavulanate 875-125 MG tablet Commonly known as:  AUGMENTIN   loratadine 10 MG tablet Commonly known as:  CLARITIN   pantoprazole 40 MG tablet Commonly known as:   PROTONIX     TAKE these medications     Indication  DULoxetine HCl 40 MG Cpep Take 40 mg by mouth daily. For mood control  Indication:  Major Depressive Disorder   hydrOXYzine 25 MG tablet Commonly known as:  ATARAX/VISTARIL Take 1 tablet (25 mg total) by mouth every 6 (six) hours as needed for anxiety.  Indication:  Feeling Anxious   traZODone 50 MG tablet Commonly known as:  DESYREL Take 1 tablet (50 mg total) by mouth at bedtime as needed for sleep.  Indication:  Trouble Sleeping      Follow-up Information    Patient declines follow-up referrals. Follow up.  Follow-up recommendations:  Continue activity as tolerated. Continue diet as recommended by your PCP. Ensure to keep all appointments with outpatient providers.  Comments:  Patient is instructed prior to discharge to: Take all medications as prescribed by his/her mental healthcare provider. Report any adverse effects and or reactions from the medicines to his/her outpatient provider promptly. Patient has been instructed & cautioned: To not engage in alcohol and or illegal drug use while on prescription medicines. In the event of worsening symptoms, patient is instructed to call the crisis hotline, 911 and or go to the nearest ED for appropriate evaluation and treatment of symptoms. To follow-up with his/her primary care provider for your other medical issues, concerns and or health care needs.    Signed: Gerlene Burdock Money, FNP 07/10/2018, 8:03 AM   Patient seen, Suicide Assessment Completed.  Disposition Plan Reviewed

## 2018-07-07 NOTE — Progress Notes (Signed)
Patient discharged to lobby. Patient was stable and appreciative at that time. All papers and prescriptions were given and valuables returned. Verbal understanding expressed. Denies SI/HI and A/VH. Patient given opportunity to express concerns and ask questions.  

## 2020-06-15 IMAGING — US US ABDOMEN LIMITED
1 series · 14 of 25 positions shown · non-contrast
Comparison: None.

CLINICAL DATA: Elevated LFTs.  Previous cholecystectomy.

EXAM:
ULTRASOUND ABDOMEN LIMITED RIGHT UPPER QUADRANT

[Series 1: us abdomen limited · 0.23mm/px · 14 of 41 slices shown]
[im 1/41]
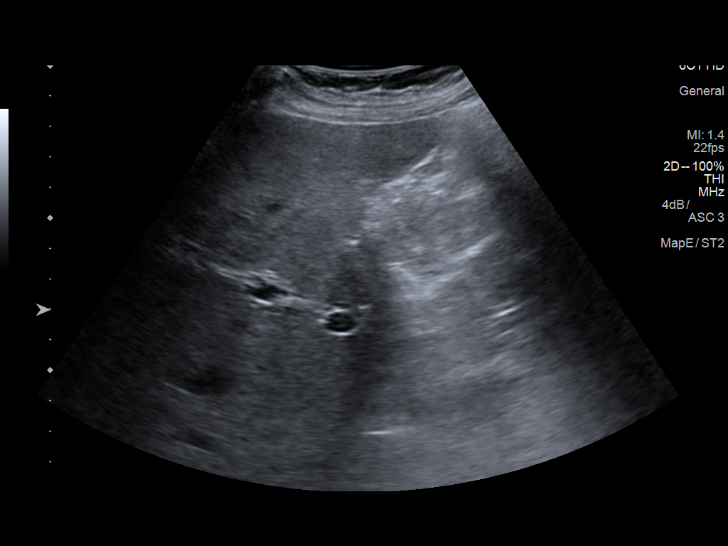
[im 4/41]
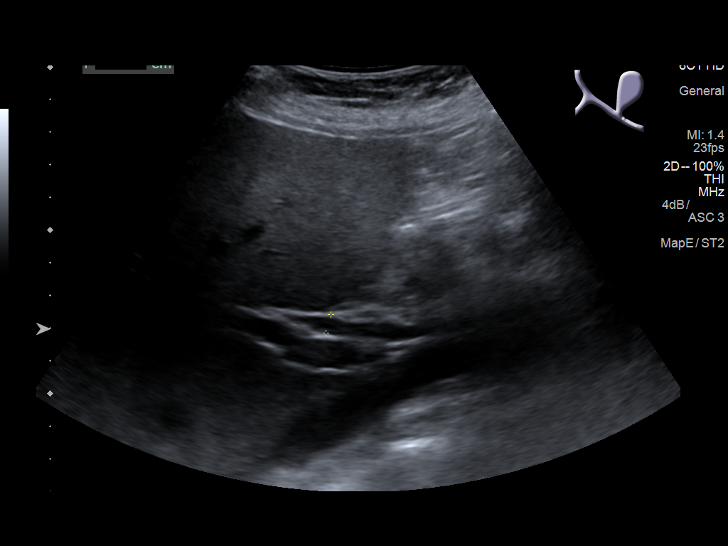
[im 7/41]
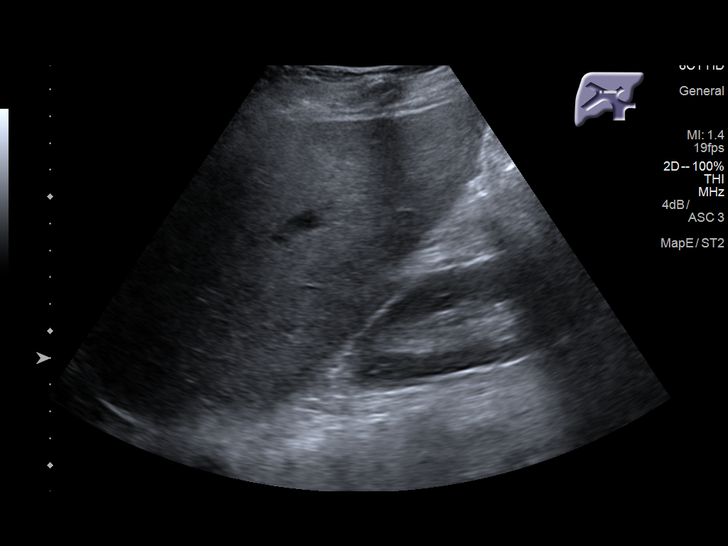
[im 11/41]
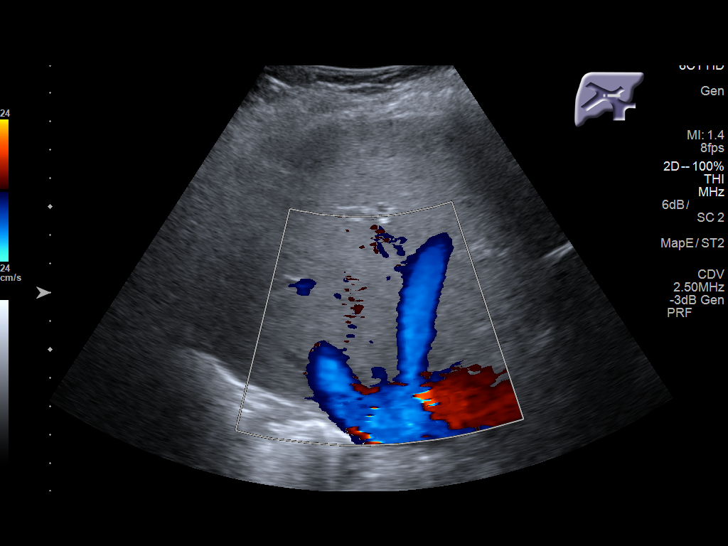
[im 14/41]
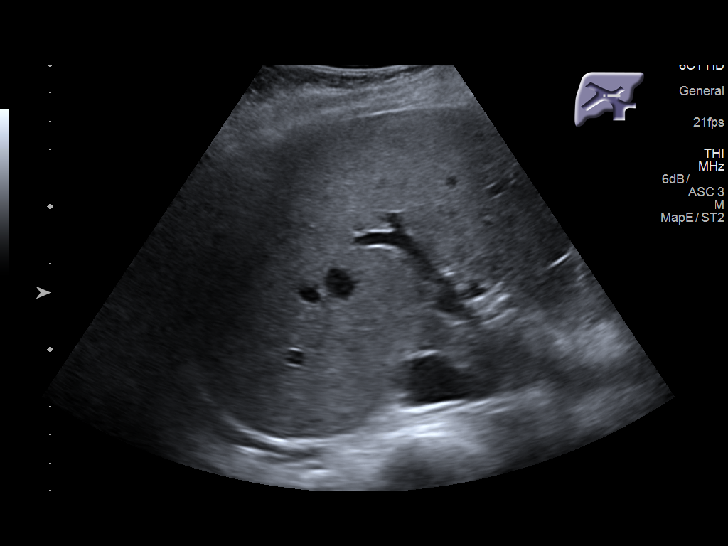
[im 16/41]
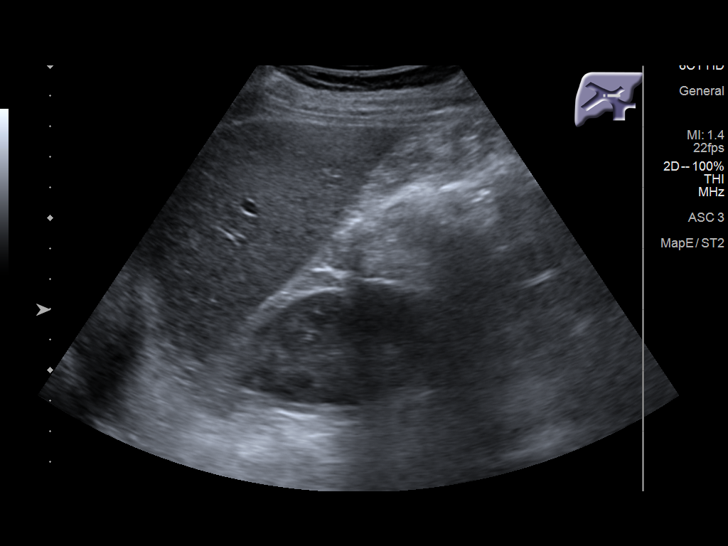
[im 19/41]
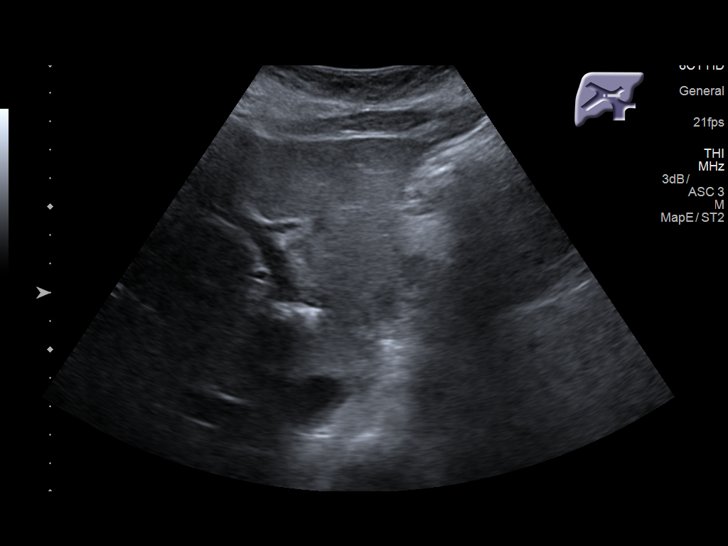
[im 22/41]
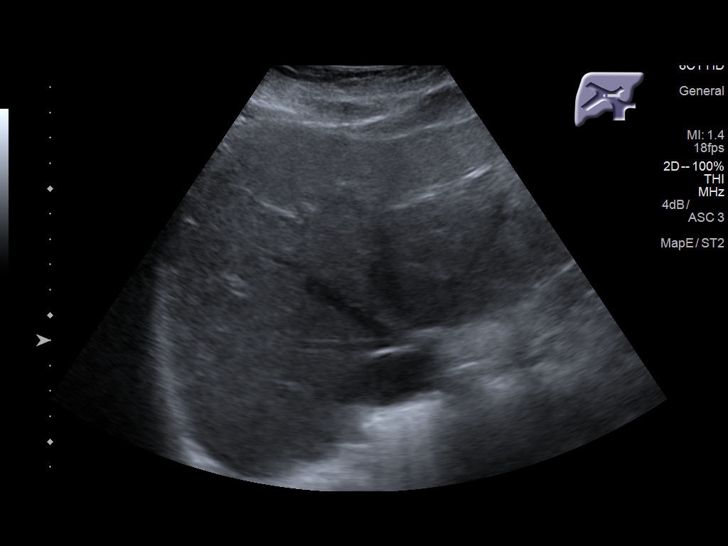
[im 26/41]
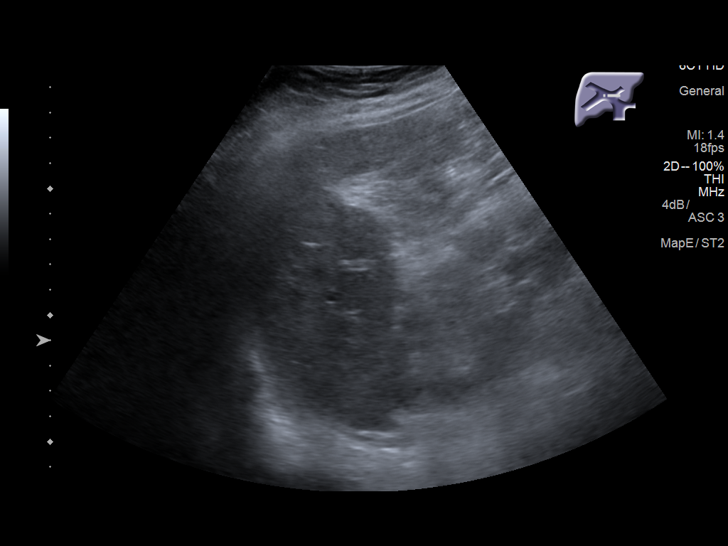
[im 27/41]
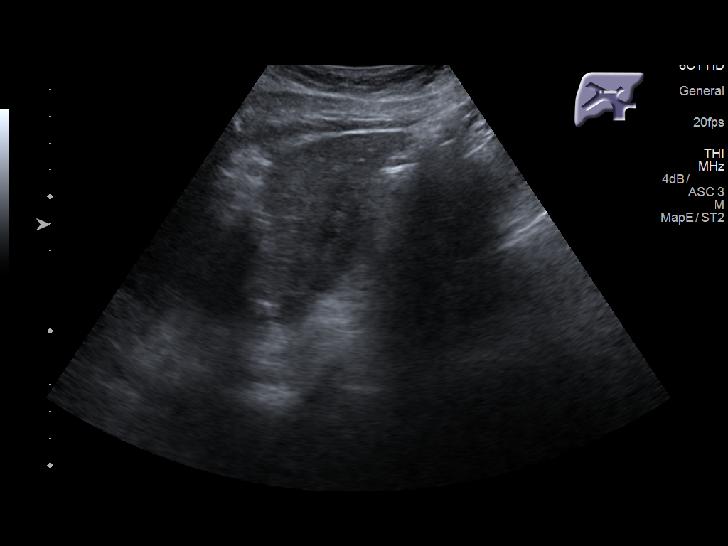
[im 31/41]
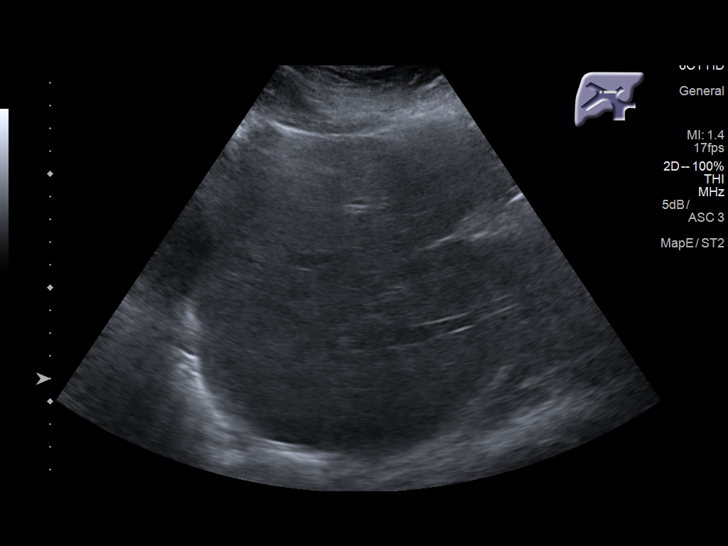
[im 34/41]
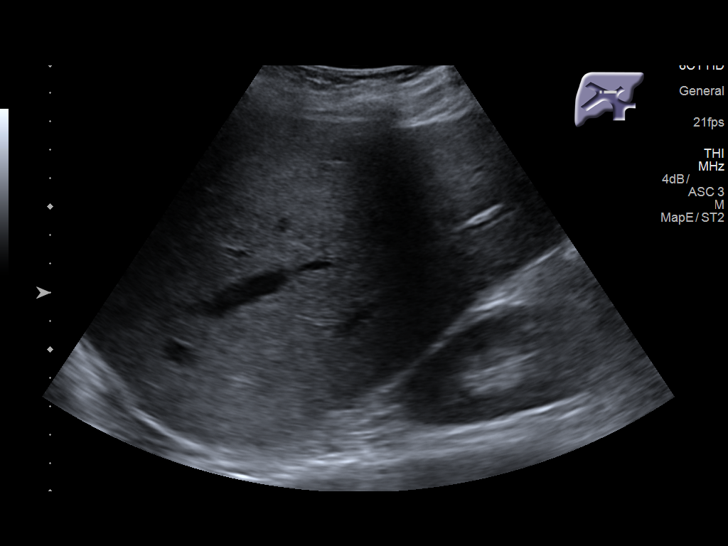
[im 37/41]
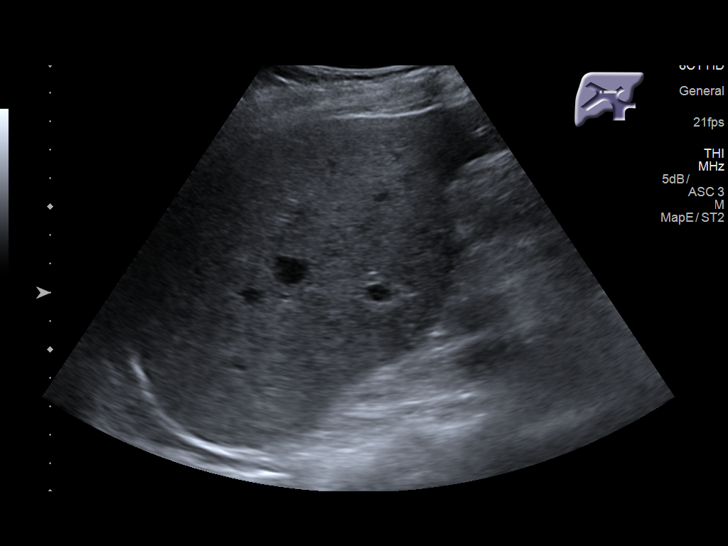
[im 41/41]
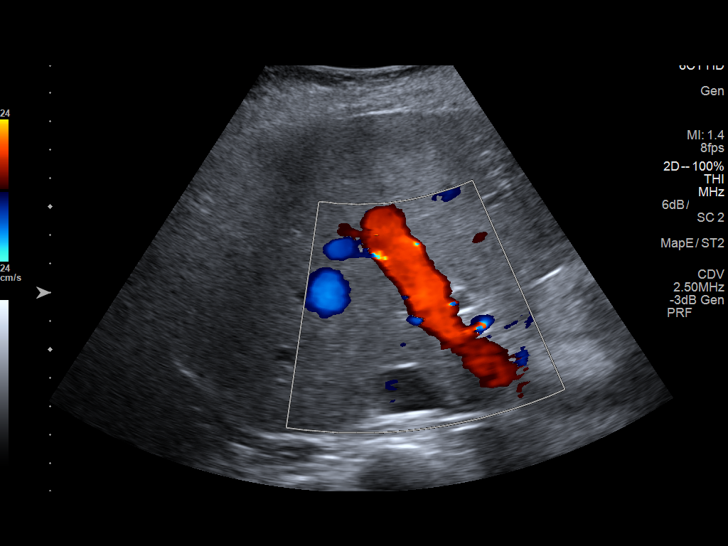

[14 of 25 positions shown; findings below may reference images not displayed]

FINDINGS: Gallbladder:

Prior cholecystectomy.

Common bile duct:

Diameter: 5.9 mm.

Liver:

No focal lesion identified. Within normal limits in parenchymal
echogenicity. Portal vein is patent on color Doppler imaging with
normal direction of blood flow towards the liver.

Small amount right pleural fluid.
IMPRESSION: Previous cholecystectomy.  No acute findings.

## 2020-12-31 DEATH — deceased
